# Patient Record
Sex: Female | Born: 1980 | Race: Black or African American | Hispanic: No | Marital: Married | Smoking: Former smoker
Health system: Southern US, Community
[De-identification: ages and names within clinical notes are randomized; demographics above are authoritative.]

## PROBLEM LIST (undated history)

## (undated) ENCOUNTER — Inpatient Hospital Stay (HOSPITAL_COMMUNITY): Payer: Self-pay

## (undated) DIAGNOSIS — IMO0002 Reserved for concepts with insufficient information to code with codable children: Secondary | ICD-10-CM

## (undated) DIAGNOSIS — D649 Anemia, unspecified: Secondary | ICD-10-CM

## (undated) DIAGNOSIS — R87619 Unspecified abnormal cytological findings in specimens from cervix uteri: Secondary | ICD-10-CM

## (undated) DIAGNOSIS — Z5189 Encounter for other specified aftercare: Secondary | ICD-10-CM

## (undated) DIAGNOSIS — B999 Unspecified infectious disease: Secondary | ICD-10-CM

## (undated) HISTORY — DX: Unspecified infectious disease: B99.9

## (undated) HISTORY — DX: Anemia, unspecified: D64.9

## (undated) HISTORY — DX: Reserved for concepts with insufficient information to code with codable children: IMO0002

## (undated) HISTORY — PX: WISDOM TOOTH EXTRACTION: SHX21

## (undated) HISTORY — DX: Encounter for other specified aftercare: Z51.89

## (undated) HISTORY — DX: Unspecified abnormal cytological findings in specimens from cervix uteri: R87.619

---

## 2000-03-27 DIAGNOSIS — B999 Unspecified infectious disease: Secondary | ICD-10-CM

## 2000-03-27 HISTORY — DX: Unspecified infectious disease: B99.9

## 2002-03-27 DIAGNOSIS — D649 Anemia, unspecified: Secondary | ICD-10-CM

## 2002-03-27 HISTORY — DX: Anemia, unspecified: D64.9

## 2003-03-28 HISTORY — PX: CHOLECYSTECTOMY: SHX55

## 2003-06-13 ENCOUNTER — Emergency Department (HOSPITAL_COMMUNITY): Admission: AD | Admit: 2003-06-13 | Discharge: 2003-06-13 | Payer: Self-pay | Admitting: Family Medicine

## 2003-08-02 ENCOUNTER — Emergency Department (HOSPITAL_COMMUNITY): Admission: EM | Admit: 2003-08-02 | Discharge: 2003-08-02 | Payer: Self-pay | Admitting: Family Medicine

## 2003-08-20 ENCOUNTER — Encounter (INDEPENDENT_AMBULATORY_CARE_PROVIDER_SITE_OTHER): Payer: Self-pay | Admitting: *Deleted

## 2003-08-20 ENCOUNTER — Observation Stay (HOSPITAL_COMMUNITY): Admission: RE | Admit: 2003-08-20 | Discharge: 2003-08-21 | Payer: Self-pay | Admitting: General Surgery

## 2003-11-19 ENCOUNTER — Emergency Department (HOSPITAL_COMMUNITY): Admission: EM | Admit: 2003-11-19 | Discharge: 2003-11-19 | Payer: Self-pay | Admitting: Emergency Medicine

## 2004-06-14 ENCOUNTER — Inpatient Hospital Stay (HOSPITAL_COMMUNITY): Admission: AD | Admit: 2004-06-14 | Discharge: 2004-06-14 | Payer: Self-pay | Admitting: *Deleted

## 2004-08-11 ENCOUNTER — Emergency Department (HOSPITAL_COMMUNITY): Admission: EM | Admit: 2004-08-11 | Discharge: 2004-08-11 | Payer: Self-pay | Admitting: Family Medicine

## 2005-05-08 ENCOUNTER — Emergency Department (HOSPITAL_COMMUNITY): Admission: EM | Admit: 2005-05-08 | Discharge: 2005-05-08 | Payer: Self-pay | Admitting: Family Medicine

## 2005-05-13 ENCOUNTER — Emergency Department (HOSPITAL_COMMUNITY): Admission: EM | Admit: 2005-05-13 | Discharge: 2005-05-13 | Payer: Self-pay | Admitting: Family Medicine

## 2005-06-20 ENCOUNTER — Ambulatory Visit: Payer: Self-pay | Admitting: Family Medicine

## 2006-03-07 ENCOUNTER — Emergency Department (HOSPITAL_COMMUNITY): Admission: EM | Admit: 2006-03-07 | Discharge: 2006-03-07 | Payer: Self-pay | Admitting: Family Medicine

## 2006-03-09 ENCOUNTER — Emergency Department (HOSPITAL_COMMUNITY): Admission: EM | Admit: 2006-03-09 | Discharge: 2006-03-09 | Payer: Self-pay | Admitting: Emergency Medicine

## 2006-05-29 ENCOUNTER — Telehealth: Payer: Self-pay | Admitting: *Deleted

## 2006-12-30 ENCOUNTER — Emergency Department (HOSPITAL_COMMUNITY): Admission: EM | Admit: 2006-12-30 | Discharge: 2006-12-30 | Payer: Self-pay | Admitting: Family Medicine

## 2007-06-25 ENCOUNTER — Telehealth: Payer: Self-pay | Admitting: *Deleted

## 2007-08-08 ENCOUNTER — Emergency Department (HOSPITAL_COMMUNITY): Admission: EM | Admit: 2007-08-08 | Discharge: 2007-08-08 | Payer: Self-pay | Admitting: Family Medicine

## 2008-07-03 ENCOUNTER — Encounter: Payer: Self-pay | Admitting: Family Medicine

## 2008-07-03 ENCOUNTER — Ambulatory Visit: Payer: Self-pay | Admitting: Family Medicine

## 2008-07-03 DIAGNOSIS — J309 Allergic rhinitis, unspecified: Secondary | ICD-10-CM | POA: Insufficient documentation

## 2008-07-03 DIAGNOSIS — M545 Low back pain: Secondary | ICD-10-CM

## 2008-07-03 LAB — CONVERTED CEMR LAB
ALT: 12 units/L (ref 0–35)
AST: 14 units/L (ref 0–37)
Alkaline Phosphatase: 67 units/L (ref 39–117)
BUN: 10 mg/dL (ref 6–23)
Creatinine, Ser: 0.8 mg/dL (ref 0.40–1.20)
HDL: 51 mg/dL (ref >39)
LDL Cholesterol: 68 mg/dL (ref 0–99)
Total Protein: 6.8 g/dL (ref 6.0–8.3)

## 2008-07-07 ENCOUNTER — Encounter: Payer: Self-pay | Admitting: Family Medicine

## 2008-07-09 LAB — CONVERTED CEMR LAB
Chlamydia, DNA Probe: NEGATIVE
GC Probe Amp, Genital: NEGATIVE

## 2008-07-28 ENCOUNTER — Telehealth (INDEPENDENT_AMBULATORY_CARE_PROVIDER_SITE_OTHER): Payer: Self-pay | Admitting: Family Medicine

## 2009-08-20 ENCOUNTER — Emergency Department (HOSPITAL_COMMUNITY): Admission: EM | Admit: 2009-08-20 | Discharge: 2009-08-20 | Payer: Self-pay | Admitting: Family Medicine

## 2010-06-13 LAB — CULTURE, ROUTINE-ABSCESS

## 2010-06-17 ENCOUNTER — Encounter: Payer: Self-pay | Admitting: Family Medicine

## 2010-06-17 ENCOUNTER — Ambulatory Visit (INDEPENDENT_AMBULATORY_CARE_PROVIDER_SITE_OTHER): Payer: Self-pay | Admitting: Family Medicine

## 2010-06-17 VITALS — BP 118/72 | HR 80 | Temp 98.8°F | Ht 60.0 in | Wt 196.0 lb

## 2010-06-17 DIAGNOSIS — J309 Allergic rhinitis, unspecified: Secondary | ICD-10-CM

## 2010-06-17 DIAGNOSIS — Z124 Encounter for screening for malignant neoplasm of cervix: Secondary | ICD-10-CM

## 2010-06-17 MED ORDER — FLUTICASONE PROPIONATE 50 MCG/ACT NA SUSP: 2.0000 | Freq: Every day | NASAL | Status: DC

## 2010-06-17 NOTE — Patient Instructions (Signed)
Congratulations for quitting smoking Less sugar, more veges, more fruit, less meat, and exercise daily Goal is loss of 60 pounds

## 2010-06-17 NOTE — Assessment & Plan Note (Signed)
Resume nasal steroid.

## 2010-06-17 NOTE — Assessment & Plan Note (Signed)
PAP

## 2010-06-17 NOTE — Progress Notes (Signed)
  Subjective:    Patient ID: Sherri [name], female    DOB: 13-Jun-1980, 30 y.o.   MRN: [id number]  HPI  Her for PAP, has had abnormal in the past with cryo therapy of the cervix when she was 15.  She does not recall any abnormal PAP smears since then.  She does report a C section, and irregular menses.  Seasonal allergies are kicking up, has used nasal steroids in the past and needs a refill.  One partner, planning on marrying.  Has no vagnial complaints, does not believe that she needs testing for STD.  She does not have insurance so she wants to keep the bill down.    Review of Systems  Constitutional: Negative for activity change and appetite change.  HENT: Positive for congestion, rhinorrhea and postnasal drip.   Respiratory: Positive for cough.   Gastrointestinal: Positive for constipation.  Genitourinary: Positive for menstrual problem. Negative for dysuria and frequency.       Irregular menses  Skin: Negative for rash.  Neurological: Negative for headaches.  Psychiatric/Behavioral: Negative for suicidal ideas and dysphoric mood. The patient is not nervous/anxious.        Objective:   Physical Exam  Constitutional: She is oriented to person, place, and time. She appears well-developed and well-nourished.  HENT:  Mouth/Throat: Oropharynx is clear and moist.  Eyes: EOM are normal. Pupils are equal, round, and reactive to light.  Neck: Normal range of motion. No thyromegaly present.  Cardiovascular: Normal rate, regular rhythm, normal heart sounds and intact distal pulses.   Pulmonary/Chest: Effort normal and breath sounds normal.  Abdominal: Soft. Bowel sounds are normal. There is no tenderness.  Genitourinary: Vagina normal. No vaginal discharge found.  Musculoskeletal: Normal range of motion. She exhibits no edema.  Lymphadenopathy:    She has no cervical adenopathy.  Neurological: She is alert and oriented to person, place, and time. She has normal reflexes.  Skin:  Skin is warm and dry.  Psychiatric: Her behavior is normal.          Assessment & Plan:

## 2010-06-22 ENCOUNTER — Encounter: Payer: Self-pay | Admitting: Family Medicine

## 2010-08-12 NOTE — Op Note (Signed)
NAME:  Sherri [name], Sherri [name]                      ACCOUNT NO.:  [id number]   MEDICAL RECORD NO.:  [id number]                   PATIENT TYPE:  AMB   LOCATION:  DAY                                  FACILITY:  [facility]   PHYSICIAN:  Anselm Pancoast. Zachery Dakins, M.D.          DATE OF BIRTH:  06-Jan-1981   DATE OF PROCEDURE:  08/20/2003  DATE OF DISCHARGE:                                 OPERATIVE REPORT   PREOPERATIVE DIAGNOSES:  Chronic cholecystitis.   POSTOPERATIVE DIAGNOSES:  Chronic cholecystitis.   OPERATION:  Cholecystectomy with cholangiogram.   ANESTHESIA:  General.   SURGEON:  Anselm Pancoast. Zachery Dakins, M.D.   ASSISTANT:  Sheppard Plumber. Earlene Plater, M.D.   ANESTHESIA:  General.   HISTORY:  Sherri [name] is a 30 year old female who approximately a year  ago delivered.  She had a C section but had an infection postoperatively.  She was living in [place], [place] then and was transferred to the  [place] at [place] and was treated with antibiotics  and kind of gradually recovered. During a CT during that hospitalization,  she was noted to have multiple gallstones within her gallbladder.  She moved  to the [place] area, has episodes of epigastric pain, approximately three  weeks ago had a bad episode on a Sunday after eating late Saturday night and  then was seen by Elvina Sidle, M.D. in the emergency room who reviewed  her records and suggested that she see a general surgeon.  I saw her in the  office on the 12th and at that time she was not having any acute abdominal  pain. We obtained the records from her hospitalizations in [place]  confirming she had stones and thought it would best to proceed on with a  laparoscopic cholecystectomy. She is only 5 feet tall but 221 pounds and  there was no evidence of any hernias that I noted. She had a Pfannenstiel  GYN incision and I recommended we proceed on with a laparoscopic  cholecystectomy and cholangiogram. She was in  agreement and states that she  has not had an episode of pain over last two weeks during this short week.  The patient preoperatively was given 400 mg of Cipro being allergic to  Penicillin and then the induction of general anesthesia, endotracheal tube,  oral tube into the stomach. The abdomen was prepped with Betadine surgical  scrub and solution, draped in a sterile manner. A small incision was made in  the fatty tissue below the umbilicus after prepping her sterilely and the  fascia was identified and picked up between Kocher's and we kind of  carefully entered into the peritoneal cavity. The pursestring suture of #0  Vicryl was placed, the Hasson cannula was introduced and the gallbladder was  just kind of packed with stones but not acutely inflamed. 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 Two lateral 5 mm ports were placed at the appropriate position. The  patient's abdominal wall with her size was kind of a floppy abdominal wall.  It was difficult to really get good exposure and the adhesions around the  gallbladder were taken down sharply and then the proximal portion of the  gallbladder we opened up the peritoneum and then dissected down identifying  the junction of the gallbladder in the cystic duct.  In the cystic artery, a  little area that we thought was just a branch of the anterior cystic artery  was doubly clipped proximally, singly distally and divided and then the  cystic duct was encompassed at its junction and clip placed flush with the  gallbladder.  A small opening was made in the proximal cystic duct, Cook  catheter introduced, held in place with a clip and x-ray was obtained which  showed probably about a 3 cm cystic duct and it had flow up to the  infrahepatic and into the duodenum. The catheter was removed and the cystic  duct was triply  clipped and then divided. The cystic lymph node was then  dissected and a little clip was placed on a little blood vessel that did not  look the major branch of the cystic artery but we never found any other  obvious blood vessel. The posterior branch was doubly clipped proximally and  then divided and then the gallbladder was freed from its bed using the hook  electrocautery.  I placed the gallbladder in an EndoCatch bag, there was a  little bit of clear bile in one area where the gallbladder was very adherent  right at the most distal portion of the gallbladder fairly dense adhesions.  The inspection of the more proximal area, hemostasis appeared adequate. We  irrigated and aspirated, irrigated and aspirated making sure there was no  evidence of any bleeding since I was not as confident as I would like to  have been as far as we definitely had identified the cystic artery and  expect that we identified it distal to the main branch that is why we saw  two vessels up in this area, neither very large. Both of these were doubly  clipped proximally.  Then we switched cameras to the upper 10 mm port,  withdrew the gallbladder __________ within the EndoCatch bag and then  reinserted the camera at the umbilicus and then reinspected and no evidence  of any bleeding. The little irrigating fluid in the pelvis was removed, she  has got numerous adhesions in the pelvis but there was nothing that looked  acutely inflamed and we then switched cameras to the upper 10 mm port again,  withdrew the Hasson cannula and put a second figure-of-eight suture at the  umbilicus, tied both and then anesthetized these. Looking at the umbilical  fascia from the inside, there was good fascia water tight closure and the  two lateral 5 mm ports withdrawn under direct vision.  The carbon dioxide  released, we had placed Marcaine at the umbilicus also and then the upper 10 mm trocar withdrawn. The subcutaneous wounds were  closed with 4-0 Vicryl and  hopefully she will tolerate a diet without problems today and be discharged  tomorrow.  Anselm Pancoast. Zachery Dakins, M.D.    WJW/MEDQ  D:  08/20/2003  T:  08/20/2003  Job:  [id number]

## 2010-12-01 ENCOUNTER — Ambulatory Visit (INDEPENDENT_AMBULATORY_CARE_PROVIDER_SITE_OTHER): Payer: Self-pay | Admitting: Family Medicine

## 2010-12-01 ENCOUNTER — Encounter: Payer: Self-pay | Admitting: Family Medicine

## 2010-12-01 VITALS — BP 125/80 | HR 100 | Temp 98.2°F | Wt 199.8 lb

## 2010-12-01 DIAGNOSIS — R21 Rash and other nonspecific skin eruption: Secondary | ICD-10-CM

## 2010-12-01 MED ORDER — TRIAMCINOLONE ACETONIDE 0.1 % EX CREA: TOPICAL_CREAM | Freq: Two times a day (BID) | CUTANEOUS | Status: AC

## 2010-12-01 NOTE — Assessment & Plan Note (Signed)
I am not sure the cause of this rash however I suspect this may be inflammatory/autoimmune.   We discussed the options of going to a dermatologist for self-pay or a biopsy or empiric treatment with steroids. The patient and I came to the agreement to attempt empiric treatment with low potency steroids. We will start with over-the-counter cortisone, and I did write for triamcinolone 0.1% cream to use if the hydrocortisone does not work.  She understands the risk of skin hypopigmentation and atrophy.   Plan to use until the skin is back to normal. We will followup in one month

## 2010-12-01 NOTE — Progress Notes (Signed)
Ms. Roger Shelter presents to the clinic today for rash.  She notes rash in her bilateral eyebrows 2 weeks following a eyebrow waxing episode.  She had her eyebrows waxed 2 months ago and noted 2 weeks afterwards beginning of rash.  She noticed the rash is itchy and flaky.  She has been applying Vaseline to take the flakiness down.  Additionally she is applying makeup overlying the rash and she works as a Tree surgeon.  She's had her eyebrows waxed multiple times and this has never happened before this episode.  She denies any personal or family history of lupus.  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  She feels well other well otherwise and denies any other symptoms.  PMH reviewed.  ROS as above otherwise neg  Exam:  BP 125/80  Pulse 100  Temp(Src) 98.2 F (36.8 C) (Oral)  Wt 199 lb 12.8 oz (90.629 kg)  LMP 11/24/2010 Gen: Well NAD HEENT: EOMI,  MMM Skin: Loss of hair follicles in the eyebrows bilaterally.  Both eyebrows on the medial aspect has a dime-sized patch of underlying skin thickness erythema with blue hyperpigmentation and fine scale.  The area is not hot or tender to the touch, and there is no surrounding erythema or inflammation.   She does have evidence of pustular acne now resolved on her face, however she does not have any other rashes elsewhere.

## 2010-12-01 NOTE — Patient Instructions (Signed)
Thank you for coming in today. Get some hydrocortisone 1% cream from the pharmacy and apply a small amount right on the rash twice a day for 1 week.  If you don't see any improvement I would like you do to the same with triamcinolone cream that I sent to the pharmacy.  Once the rash goes away and the skin returns to normal stop applying the creams.   You may have to apply some cream intermittently.  If you are not totally better in 1 month come back.

## 2010-12-17 ENCOUNTER — Ambulatory Visit (INDEPENDENT_AMBULATORY_CARE_PROVIDER_SITE_OTHER): Payer: Self-pay

## 2010-12-17 ENCOUNTER — Inpatient Hospital Stay (INDEPENDENT_AMBULATORY_CARE_PROVIDER_SITE_OTHER)
Admission: RE | Admit: 2010-12-17 | Discharge: 2010-12-17 | Disposition: A | Discharge status: Home / Self Care | Payer: Self-pay | Source: Ambulatory Visit | Attending: Emergency Medicine | Admitting: Emergency Medicine

## 2010-12-17 DIAGNOSIS — M779 Enthesopathy, unspecified: Secondary | ICD-10-CM

## 2012-04-01 ENCOUNTER — Encounter (HOSPITAL_COMMUNITY): Payer: Self-pay | Admitting: *Deleted

## 2012-04-01 ENCOUNTER — Emergency Department (HOSPITAL_COMMUNITY)
Admission: EM | Admit: 2012-04-01 | Discharge: 2012-04-01 | Disposition: A | Discharge status: Home / Self Care | Payer: Self-pay | Source: Home / Self Care

## 2012-04-01 DIAGNOSIS — S39012A Strain of muscle, fascia and tendon of lower back, initial encounter: Secondary | ICD-10-CM

## 2012-04-01 DIAGNOSIS — S335XXA Sprain of ligaments of lumbar spine, initial encounter: Secondary | ICD-10-CM

## 2012-04-01 DIAGNOSIS — Z3490 Encounter for supervision of normal pregnancy, unspecified, unspecified trimester: Secondary | ICD-10-CM

## 2012-04-01 DIAGNOSIS — N912 Amenorrhea, unspecified: Secondary | ICD-10-CM

## 2012-04-01 LAB — POCT PREGNANCY, URINE: Preg Test, Ur: POSITIVE — AB

## 2012-04-01 MED ORDER — NAPROXEN 500 MG PO TBEC: 500.0000 mg | DELAYED_RELEASE_TABLET | Freq: Two times a day (BID) | ORAL | Status: DC

## 2012-04-01 MED ORDER — PRENATAL VITAMINS PLUS 27-1 MG PO TABS: 1.0000 | ORAL_TABLET | Freq: Every day | ORAL | Status: AC

## 2012-04-01 NOTE — ED Provider Notes (Signed)
Medical screening examination/treatment/procedure(s) were performed by non-physician practitioner and as supervising physician I was immediately available for consultation/collaboration.  Leslee Home, M.D.   Reuben Likes, MD 04/01/12 [phone]

## 2012-04-01 NOTE — ED Notes (Signed)
Pt reports lower back pain for the past week and a half denies urinary pain or discharge - urine obtained at triage.

## 2012-04-01 NOTE — ED Provider Notes (Signed)
History     CSN: [id number]  Arrival date & time 04/01/12  1005   None     Chief Complaint  Patient presents with  . Back Pain    (Consider location/radiation/quality/duration/timing/severity/associated sxs/prior treatment) HPI Comments: 32 year old female is complaining of mid low back pain for several weeks. The pain is located in the lower paralumbar musculature. 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 When she goes home she is able to relax and get a comfortable position the pain abates. She also states she is 4-5 days past her due date for her menses.   History reviewed. No pertinent past medical history.  History reviewed. No pertinent past surgical history.  Family History  Problem Relation Age of Onset  . Family history unknown: Yes    History  Substance Use Topics  . Smoking status: Former Smoker    Quit date: 05/27/2010  . Smokeless tobacco: Not on file  . Alcohol Use: No    OB History    Grav Para Term Preterm Abortions TAB SAB Ect Mult Living                  Review of Systems  Constitutional: Negative for fever, chills and activity change.  HENT: Negative.   Respiratory: Negative.   Cardiovascular: Negative.   Genitourinary: Positive for menstrual problem. Negative for dysuria, urgency, frequency, hematuria, flank pain, decreased urine volume, vaginal bleeding, vaginal discharge, difficulty urinating, vaginal pain and pelvic pain.  Musculoskeletal:       As per HPI  Skin: Negative for color change, pallor and rash.  Neurological: Negative.   Psychiatric/Behavioral: Negative.     Allergies  Review of patient's allergies indicates no known allergies.  Home Medications   Current Outpatient Rx  Name  Route  Sig  Dispense  Refill  . FLUTICASONE PROPIONATE 50  MCG/ACT NA SUSP   Nasal   2 sprays by Nasal route daily.   16 g   3   . PRENATAL VITAMINS PLUS 27-1 MG PO TABS   Oral   Take 1 tablet by mouth daily. 1 tab po once daily   30 tablet   0     BP 136/81  Pulse 88  Temp 98.7 F (37.1 C) (Oral)  Resp 18  SpO2 100%  LMP 02/25/2012  Physical Exam  Constitutional: She is oriented to person, place, and time. She appears well-developed and well-nourished. No distress.  HENT:  Head: Normocephalic and atraumatic.  Eyes: EOM are normal. Pupils are equal, round, and reactive to light.  Neck: Normal range of motion. Neck supple.  Cardiovascular: Normal rate and normal heart sounds.   No murmur heard. Pulmonary/Chest: Effort normal and breath sounds normal. No respiratory distress. She has no wheezes.  Abdominal: Soft. She exhibits no distension and no mass. There is no rebound and no guarding.       Minor tenderness in the right and lower quadrants. Most of the tenderness is located along the anterior aspects of the iliac crests bilaterally  Musculoskeletal: Normal range of motion. She exhibits no edema.  Neurological: She is alert and oriented to person, place, and time. No cranial nerve deficit.  Skin: Skin is warm and dry.    ED Course  Procedures (including critical care time)  Labs  Reviewed  POCT PREGNANCY, URINE - Abnormal; Notable for the following:    Preg Test, Ur POSITIVE (*)     All other components within normal limits   No results found.   1. Lumbar strain   2. Amenorrhea   3. Pregnancy       MDM  No NSAIDs. Prenatal vitamins prescription written She was given verbal and written instructions on back maneuvers such as stretching and other exercises should help with her back pain. She may apply heat to the areas of pain in her back and if possible to not to stand so long at a time. Followup with your PCP in one needed appointment with an obstetrician in the next couple of months.        Hayden Rasmussen,  NP 04/01/12 1134

## 2012-04-22 ENCOUNTER — Ambulatory Visit: Payer: Medicaid Other | Admitting: Obstetrics and Gynecology

## 2012-04-22 ENCOUNTER — Encounter: Payer: Self-pay | Admitting: Obstetrics and Gynecology

## 2012-04-22 DIAGNOSIS — Z331 Pregnant state, incidental: Secondary | ICD-10-CM

## 2012-04-22 LAB — POCT URINALYSIS DIPSTICK
Leukocytes, UA: NEGATIVE
Nitrite, UA: NEGATIVE
Protein, UA: NEGATIVE
pH, UA: 5

## 2012-04-22 NOTE — Progress Notes (Signed)
NOB interview. Exposed to hair products at work. Records requested from [facility] and [place], [place] regarding C/S and stillbirth.

## 2012-04-23 LAB — CULTURE, OB URINE: Colony Count: NO GROWTH

## 2012-04-23 LAB — GC/CHLAMYDIA PROBE AMP
CT Probe RNA: NEGATIVE
GC Probe RNA: NEGATIVE

## 2012-04-24 LAB — PRENATAL PANEL VII
Basophils Absolute: 0 10*3/uL (ref 0.0–0.1)
Eosinophils Absolute: 0.1 10*3/uL (ref 0.0–0.7)
HCT: 35.9 % — ABNORMAL LOW (ref 36.0–46.0)
HIV: NONREACTIVE
Lymphocytes Relative: 31 % (ref 12–46)
Lymphs Abs: 1.5 10*3/uL (ref 0.7–4.0)
MCHC: 34.3 g/dL (ref 30.0–36.0)
Neutrophils Relative %: 59 % (ref 43–77)
RBC: 3.94 MIL/uL (ref 3.87–5.11)
RDW: 14.1 % (ref 11.5–15.5)
Rh Type: POSITIVE

## 2012-04-24 LAB — HEMOGLOBINOPATHY EVALUATION
Hgb F Quant: 0 % (ref 0.0–2.0)
Hgb S Quant: 0 %

## 2012-05-03 ENCOUNTER — Encounter: Payer: Self-pay | Admitting: Obstetrics and Gynecology

## 2012-05-03 NOTE — Progress Notes (Addendum)
Records from 2004 PP hospitalization at [facility] and delivery records from [place] including stillbirth available in medical records.

## 2012-05-09 ENCOUNTER — Ambulatory Visit: Payer: Medicaid Other | Admitting: Obstetrics and Gynecology

## 2012-05-09 ENCOUNTER — Encounter: Payer: Self-pay | Admitting: Obstetrics and Gynecology

## 2012-05-09 VITALS — BP 114/72 | Wt 206.0 lb

## 2012-05-09 DIAGNOSIS — J45909 Unspecified asthma, uncomplicated: Secondary | ICD-10-CM

## 2012-05-09 DIAGNOSIS — Z369 Encounter for antenatal screening, unspecified: Secondary | ICD-10-CM

## 2012-05-09 DIAGNOSIS — Z98891 History of uterine scar from previous surgery: Secondary | ICD-10-CM

## 2012-05-09 DIAGNOSIS — O09299 Supervision of pregnancy with other poor reproductive or obstetric history, unspecified trimester: Secondary | ICD-10-CM

## 2012-05-09 DIAGNOSIS — E669 Obesity, unspecified: Secondary | ICD-10-CM

## 2012-05-09 DIAGNOSIS — O0990 Supervision of high risk pregnancy, unspecified, unspecified trimester: Secondary | ICD-10-CM

## 2012-05-09 DIAGNOSIS — Z124 Encounter for screening for malignant neoplasm of cervix: Secondary | ICD-10-CM

## 2012-05-09 NOTE — Progress Notes (Signed)
[redacted]w[redacted]d Pt states she had a little bit of pink spotting after i/c. No other problems.

## 2012-05-09 NOTE — Addendum Note (Signed)
Addended byWinfred Leeds on: 05/09/2012 02:12 PM   Modules accepted: Orders

## 2012-05-09 NOTE — Progress Notes (Addendum)
[facility] NEW OB EXAMINATION   Sherri [name] is a 32 y.o. female, G3P2001, who presents at [redacted]w[redacted]d gestation for a new obstetrical examination. The patient states that her last menstrual period on February 25, 2012 was completely normal.  Her due date is December 01, 2012.  She has not had an ultrasound during this pregnancy.  The patient had one episode of spotting after intercourse 2 weeks ago.  In 2002 the patient had a term pregnancy with a fetal demise of uncertain etiology.  No autopsy was performed.  The patient had a cesarean section in 2006 after a failed induction and then prolonged rupture of membranes.  She had a large blood loss requiring a blood transfusion.  The patient has a past history of asthma.  She has not had an asthma attack in 2 years.  The following portions of the patient's history were reviewed and updated as appropriate: allergies, current medications, past family history, past medical history, past social history, past surgical history and problem list.  OB History   Grav Para Term Preterm Abortions TAB SAB Ect Mult Living   3 2 2       1      Obstetric Comments   2004 FAILED INDUCTION;  PROLONGED ROM; C/S; PP BLOOD TRANSFUSION DUE TO BLOOD LOSS DURING C/S; PP ENDOMETRITIS; HOSPITALIZED FOR 4 WEEKS AT [facility]      Past Medical History  Diagnosis Date  . Blood transfusion without reported diagnosis     PP 2006  . Abnormal Pap smear AGE 41     COLPO; LAST PAP 2012  . Infection 2002    CHLAMYDIA  . Infection 2002    GC  . Infection 2004    PP ENDOMETRITIS  . Anemia 2004    PP    Past Surgical History  Procedure Laterality Date  . Cesarean section  2004  . Cholecystectomy  2005  . Wisdom tooth extraction  AS TEEN    Family History  Problem Relation Age of Onset  . Diabetes Mother   . Heart disease Mother   . Arthritis Father   . COPD Father   . Cancer Father     PROSTATE  . Hypertension Father     Social History:  reports that she quit  smoking about 1 years ago. She has never used smokeless tobacco. She reports that  drinks alcohol. She reports that she uses illicit drugs about twice per week.  Allergies: No Known Allergies  Medications: prenatal vitamins, Flonase as needed   Objective:    BP 114/72  Wt 206 lb (93.441 kg)  BMI 40.23 kg/m2  LMP 02/25/2012    Weight:  Wt Readings from Last 1 Encounters:  05/09/12 206 lb (93.441 kg)          BMI: Body mass index is 40.23 kg/(m^2).  General Appearance: Alert, appropriate appearance for age. No acute distress HEENT: Grossly normal Neck / Thyroid: Supple, no masses, nodes or enlargement Lungs: clear to auscultation bilaterally Back: No CVA tenderness Breast Exam: No masses or nodes.No dimpling, nipple retraction or discharge. Cardiovascular: Regular rate and rhythm. S1, S2, no murmur Gastrointestinal: Soft, non-tender, no masses or organomegaly.                               Fundal height: 10 weeks  Fetal heart tones audible: yes, 150 bpm  ++++++++++++++++++++++++++++++++++++++++++++++++++++++++  Pelvic Exam: External genitalia: normal general appearance Vaginal: normal without tenderness, induration or masses and relaxation: Yes Cervix: normal appearance Adnexa: normal bimanual exam Uterus: gravid, nontender, 10 weeks size  ++++++++++++++++++++++++++++++++++++++++++++++++++++++++  Lymphatic Exam: Non-palpable nodes in neck, clavicular, axillary, or inguinal regions Neurologic: Normal speech, no tremor  Psychiatric: Alert and oriented, appropriate affect.  Prenatal labs: ABO, Rh: O/POS/-- (01/27 0102) Antibody: NEG (01/27 0938) Rubella:  immune RPR: NON REAC (01/27 7253)  HBsAg: NEGATIVE (01/27 6644)  HIV: NON REACTIVE (01/27 0938)  GBS:   pending until the third trimester Hemoglobin: 12.3 Platelet count: 257,000 Sickle cell: Negative Gonorrhea: Negative Chlamydia: Negative Urine culture: Negative  Wet Prep:    Previously done:            no                     If no: Whiff:                     Negative                              Clue cells:             no                              PH:                        > 4.5                              Yeast:                    no                              Trichomoniasis:    no    Assessment:   32 y.o. female G3P2001 at [redacted]w[redacted]d gestation ( EDC is @EDC @) by: Normal Last menstrual period: yes                History of a term fetal demise of uncertain etiology  History of a prior cesarean section  History of asthma  Obesity  Desires sterilization   Plan:    Pap smear sent.  We discussed routine pregnancy issues:  Toxoplasmosis was reviewed.  The patient was told to avoid cat liter boxes and feces.  The patient was told to avoid predator fish including tuna because of our concerns for mercury consumption.  The patient was told to avoid soft cheeses.  The patient was told to be sure that all lunch meats are well cooked.  Genetic screening was discussed. First trimester screen in 2 weeks.  The patient will have an early Glucola screen due to a history of prior pregnancy with fetal demise.  Our model for pregnancy management was reviewed.  Proper diet and exercise reviewed.  Return to office in 2 weeks.  Medications include:  Prenatal vitamins.  VBAC consent form given to the patient.  She will review and then sign at next visit.  Mylinda Latina.D.

## 2012-05-13 LAB — PAP IG W/ RFLX HPV ASCU

## 2012-05-23 ENCOUNTER — Encounter: Payer: Self-pay | Admitting: Obstetrics and Gynecology

## 2012-05-23 ENCOUNTER — Ambulatory Visit: Payer: Medicaid Other

## 2012-05-23 ENCOUNTER — Ambulatory Visit: Payer: Medicaid Other | Admitting: Obstetrics and Gynecology

## 2012-05-23 ENCOUNTER — Other Ambulatory Visit: Payer: Self-pay | Admitting: Obstetrics and Gynecology

## 2012-05-23 VITALS — BP 110/70 | Wt 212.0 lb

## 2012-05-23 DIAGNOSIS — Z331 Pregnant state, incidental: Secondary | ICD-10-CM

## 2012-05-23 DIAGNOSIS — Z369 Encounter for antenatal screening, unspecified: Secondary | ICD-10-CM

## 2012-05-23 NOTE — Progress Notes (Signed)
[redacted]w[redacted]d [place] s=d  NT 1.8 mm First trrimester scfreen done today

## 2012-05-23 NOTE — Progress Notes (Signed)
Pt declines flu shot at this time.

## 2012-06-13 ENCOUNTER — Ambulatory Visit: Payer: Medicaid Other | Admitting: Obstetrics and Gynecology

## 2012-06-13 ENCOUNTER — Encounter: Payer: Self-pay | Admitting: Obstetrics and Gynecology

## 2012-06-13 VITALS — BP 108/60 | Wt 215.0 lb

## 2012-06-13 DIAGNOSIS — Z331 Pregnant state, incidental: Secondary | ICD-10-CM

## 2012-06-13 NOTE — Patient Instructions (Signed)
Round Ligament Pain  The round ligament is made up of muscle and fibrous tissue. It is attached to the uterus near the fallopian tube. The round ligament is located on both sides of the uterus and helps support the position of the uterus. It usually begins in the second trimester of pregnancy when the uterus comes out of the pelvis. The pain can come and go until the baby is delivered. Round ligament pain is not a serious problem and does not cause harm to the baby.  CAUSE  During pregnancy the uterus grows the most from the second trimester to delivery. As it grows, it stretches and slightly twists the round ligaments. When the uterus leans from one side to the other, the round ligament on the opposite side pulls and stretches. This can cause pain.  SYMPTOMS   Pain can occur on one side or both sides. The pain is usually a short, sharp, and pinching-like. Sometimes it can be a dull, lingering and aching pain. The pain is located in the lower side of the abdomen or in the groin. The pain is internal and usually starts deep in the groin and moves up to the outside of the hip area. Pain can occur with:  · Sudden change in position like getting out of bed or a chair.  · Rolling over in bed.  · Coughing or sneezing.  · Walking too much.  · Any type of physical activity.  DIAGNOSIS   Your caregiver will make sure there are no serious problems causing the pain. When nothing serious is found, the symptoms usually indicate that the pain is from the round ligament.  TREATMENT   · Sit down and relax when the pain starts.  · Flex your knees up to your belly.  · Lay on your side with a pillow under your belly (abdomen) and another one between your legs.  · Sit in a hot bath for 15 to 20 minutes or until the pain goes away.  HOME CARE INSTRUCTIONS   · Only take over-the-counter or prescriptions medicines for pain, discomfort or fever as directed by your caregiver.  · Sit and stand slowly.  · Avoid long walks if it causes  pain.  · Stop or lessen your physical activities if it causes pain.  SEEK MEDICAL CARE IF:   · The pain does not go away with any of your treatment.  · You need stronger medication for the pain.  · You develop back pain that you did not have before with the side pain.  SEEK IMMEDIATE MEDICAL CARE IF:   · You develop a temperature of 102° F (38.9° C) or higher.  · You develop uterine contractions.  · You develop vaginal bleeding.  · You develop nausea, vomiting or diarrhea.  · You develop chills.  · You have pain when you urinate.  Document Released: 12/21/2007 Document Revised: 06/05/2011 Document Reviewed: 12/21/2007  [employer]® Patient Information ©2013 [employer].

## 2012-06-13 NOTE — Progress Notes (Signed)
[redacted]w[redacted]d First trimester screen WNL Anatomy US @[facility] 

## 2012-06-13 NOTE — Progress Notes (Signed)
Pt decline flu shot at this time

## 2012-08-16 ENCOUNTER — Inpatient Hospital Stay (HOSPITAL_COMMUNITY)
Admission: AD | Admit: 2012-08-16 | Discharge: 2012-08-17 | Disposition: A | Discharge status: Home / Self Care | Payer: Medicaid Other | Source: Ambulatory Visit | Attending: Obstetrics and Gynecology | Admitting: Obstetrics and Gynecology

## 2012-08-16 ENCOUNTER — Encounter (HOSPITAL_COMMUNITY): Payer: Self-pay | Admitting: *Deleted

## 2012-08-16 DIAGNOSIS — O99891 Other specified diseases and conditions complicating pregnancy: Secondary | ICD-10-CM | POA: Insufficient documentation

## 2012-08-16 DIAGNOSIS — M549 Dorsalgia, unspecified: Secondary | ICD-10-CM | POA: Insufficient documentation

## 2012-08-16 DIAGNOSIS — IMO0002 Reserved for concepts with insufficient information to code with codable children: Secondary | ICD-10-CM | POA: Insufficient documentation

## 2012-08-16 DIAGNOSIS — M79609 Pain in unspecified limb: Secondary | ICD-10-CM | POA: Insufficient documentation

## 2012-08-16 MED ORDER — IBUPROFEN 600 MG PO TABS
600.0000 mg | ORAL_TABLET | Freq: Once | ORAL | Status: AC
  Administered 2012-08-16: 600 mg via ORAL
  Filled 2012-08-16: qty 1

## 2012-08-16 MED ORDER — IBUPROFEN 200 MG PO TABS: 600.0000 mg | ORAL_TABLET | Freq: Four times a day (QID) | ORAL | Status: DC | PRN

## 2012-08-16 NOTE — Progress Notes (Signed)
S. Lillard CNM notified of pt's admission and status. Will see pt 

## 2012-08-16 NOTE — Progress Notes (Signed)
Gevena Barre CNM in to see pt. EFM d/ced per CNM

## 2012-08-16 NOTE — MAU Note (Signed)
Noticed swelling R buttocks and R ankle since Weds. Bruising R knee.

## 2012-08-16 NOTE — MAU Provider Note (Signed)
History     CSN: [id number]  Arrival date and time: 08/16/12 2135   First Provider Initiated Contact with Patient 08/16/12 2332      Chief Complaint  Patient presents with  . Leg Swelling   HPI Comments: Pt is a 31yo G3P2001 at [redacted]w[redacted]d that arrived unannounced w cc of R hip swelling and back pain and pain in thigh. Has had pain for several days but was worse tonight after being on her feet all day today. She denies any ctx, VB or LOF, reports +FM.      Past Medical History  Diagnosis Date  . Blood transfusion without reported diagnosis     PP 2006  . Abnormal Pap smear AGE 20     COLPO; LAST PAP 2012  . Infection 2002    CHLAMYDIA  . Infection 2002    GC  . Infection 2004    PP ENDOMETRITIS  . Anemia 2004    PP    Past Surgical History  Procedure Laterality Date  . Cesarean section  2004  . Cholecystectomy  2005  . Wisdom tooth extraction  AS TEEN    Family History  Problem Relation Age of Onset  . Diabetes Mother   . Heart disease Mother   . Arthritis Father   . COPD Father   . Cancer Father     PROSTATE  . Hypertension Father     History  Substance Use Topics  . Smoking status: Former Smoker    Quit date: 05/27/2010  . Smokeless tobacco: Never Used  . Alcohol Use: Yes     Comment: WEEKENDS; LAST DRANK 03/27/2012    Allergies: No Known Allergies  Prescriptions prior to admission  Medication Sig Dispense Refill  . Prenatal Vit-Fe Fumarate-FA (PRENATAL VITAMINS PLUS) 27-1 MG TABS Take 1 tablet by mouth daily. 1 tab po once daily  30 tablet  0  . fluticasone (FLONASE) 50 MCG/ACT nasal spray 2 sprays by Nasal route daily.  16 g  3  . Prenatal Vit-Fe Fumarate-FA (VITAFOL-OB PO) Take by mouth.        Review of Systems  Respiratory: Negative for shortness of breath.   Cardiovascular: Negative for chest pain.  Musculoskeletal: Negative for joint pain and falls.       R Hip, buttock and thigh are "achey"   Neurological: Negative for dizziness, tingling,  sensory change, focal weakness, weakness and headaches.  All other systems reviewed and are negative.   Physical Exam   Blood pressure 132/76, pulse 99, temperature 98.5 F (36.9 C), resp. rate 18, height 5' (1.524 m), weight 229 lb (103.874 kg), last menstrual period 02/25/2012.  Physical Exam  Nursing note and vitals reviewed. Constitutional: She is oriented to person, place, and time. She appears well-developed and well-nourished.  HENT:  Head: Normocephalic.  Eyes: Pupils are equal, round, and reactive to light.  Neck: Normal range of motion.  Cardiovascular: Normal rate, regular rhythm and normal heart sounds.   Respiratory: Effort normal and breath sounds normal.  GI: Soft. Bowel sounds are normal. She exhibits no distension.  AGA  Genitourinary:  Deferred   Musculoskeletal: Normal range of motion. She exhibits no edema and no tenderness.  Neg homan's sign   Neurological: She is alert and oriented to person, place, and time. She has normal reflexes.  Skin: Skin is warm and dry.  Psychiatric: She has a normal mood and affect. Her behavior is normal.    MAU Course  Procedures    Assessment and  Plan  IUP at [redacted]w[redacted]d FHR reassuring for GA VSS Pain appears to be RLP and R sciatica Will give motrin 600mg  PO and Rx for motrin Handout on RLP Enc frequent stretching and sitting down breaks at work  Call office if pain worsens, swelling is significant or any other difficulty  Will let office know to Refer for  physical therapy, as well.   Darald Uzzle M 08/16/2012, 11:49 PM

## 2012-08-17 ENCOUNTER — Encounter: Payer: Self-pay | Admitting: Obstetrics and Gynecology

## 2012-08-17 NOTE — Progress Notes (Signed)
Written and verbal d/c instructions given and understanding voiced. 

## 2012-09-03 ENCOUNTER — Ambulatory Visit: Payer: Medicaid Other | Attending: Obstetrics and Gynecology

## 2012-09-03 DIAGNOSIS — IMO0001 Reserved for inherently not codable concepts without codable children: Secondary | ICD-10-CM | POA: Insufficient documentation

## 2012-09-03 DIAGNOSIS — N949 Unspecified condition associated with female genital organs and menstrual cycle: Secondary | ICD-10-CM | POA: Insufficient documentation

## 2012-09-03 DIAGNOSIS — M899 Disorder of bone, unspecified: Secondary | ICD-10-CM | POA: Insufficient documentation

## 2012-09-03 DIAGNOSIS — M259 Joint disorder, unspecified: Secondary | ICD-10-CM | POA: Insufficient documentation

## 2012-09-03 DIAGNOSIS — O9989 Other specified diseases and conditions complicating pregnancy, childbirth and the puerperium: Secondary | ICD-10-CM | POA: Insufficient documentation

## 2012-09-03 DIAGNOSIS — M5430 Sciatica, unspecified side: Secondary | ICD-10-CM | POA: Insufficient documentation

## 2012-11-09 ENCOUNTER — Inpatient Hospital Stay (HOSPITAL_COMMUNITY)
Admission: AD | Admit: 2012-11-09 | Discharge: 2012-11-10 | Disposition: A | Discharge status: Home / Self Care | Payer: Medicaid Other | Source: Ambulatory Visit | Attending: Obstetrics and Gynecology | Admitting: Obstetrics and Gynecology

## 2012-11-09 ENCOUNTER — Encounter (HOSPITAL_COMMUNITY): Payer: Self-pay | Admitting: *Deleted

## 2012-11-09 DIAGNOSIS — R81 Glycosuria: Secondary | ICD-10-CM | POA: Insufficient documentation

## 2012-11-09 DIAGNOSIS — O99891 Other specified diseases and conditions complicating pregnancy: Secondary | ICD-10-CM | POA: Insufficient documentation

## 2012-11-09 DIAGNOSIS — O26899 Other specified pregnancy related conditions, unspecified trimester: Secondary | ICD-10-CM

## 2012-11-09 DIAGNOSIS — IMO0001 Reserved for inherently not codable concepts without codable children: Secondary | ICD-10-CM | POA: Insufficient documentation

## 2012-11-09 DIAGNOSIS — R109 Unspecified abdominal pain: Secondary | ICD-10-CM | POA: Insufficient documentation

## 2012-11-09 LAB — URINALYSIS, ROUTINE W REFLEX MICROSCOPIC
Bilirubin Urine: NEGATIVE
Glucose, UA: >1000 mg/dL — AB
Ketones, ur: NEGATIVE mg/dL
Nitrite: NEGATIVE
Urobilinogen, UA: 0.2 mg/dL (ref 0.0–1.0)

## 2012-11-09 MED ORDER — ACETAMINOPHEN 325 MG PO TABS
650.0000 mg | ORAL_TABLET | Freq: Four times a day (QID) | ORAL | Status: DC | PRN
  Administered 2012-11-09: 650 mg via ORAL
  Filled 2012-11-09: qty 2

## 2012-11-09 MED ORDER — CYCLOBENZAPRINE HCL 10 MG PO TABS
10.0000 mg | ORAL_TABLET | Freq: Three times a day (TID) | ORAL | Status: DC | PRN
  Administered 2012-11-09: 10 mg via ORAL
  Filled 2012-11-09: qty 1

## 2012-11-09 NOTE — MAU Provider Note (Signed)
History   CSN: [id number]  Arrival date and time: 11/09/12 2126   Chief Complaint  Patient presents with  . Abdominal Pain   HPI Pt presents to MAU with c/o constant lower abdominal pain since approx 3:30 this afternoon which has increased in intensity.  States pain waxes and wanes at time and is sharp as well as achy.  Also reports some cramping.  Reports normal fetal activity.  Denies ROM or bldg.  Tried warm bath, rest and increasing liquids without improvement in pain prior to presentation.  Plans VBAC with this pregnancy-last preg had C/S due to failed IOL with subsequent endometritis and anemia requiring blood transfusion.  Ranks pain as 8 on scale of 10.  Pt reports she drank pepsi and ate a swiss cake roll prior to admission.    OB History   Grav Para Term Preterm Abortions TAB SAB Ect Mult Living   3 2 2       1      Obstetric Comments   2004 FAILED INDUCTION;  PROLONGED ROM; C/S; PP BLOOD TRANSFUSION DUE TO BLOOD LOSS DURING C/S; PP ENDOMETRITIS; HOSPITALIZED FOR 4 WEEKS AT [facility]      Past Medical History  Diagnosis Date  . Blood transfusion without reported diagnosis     PP 2006  . Abnormal Pap smear AGE 63     COLPO; LAST PAP 2012  . Infection 2002    CHLAMYDIA  . Infection 2002    GC  . Infection 2004    PP ENDOMETRITIS  . Anemia 2004    PP    Past Surgical History  Procedure Laterality Date  . Cesarean section  2004  . Cholecystectomy  2005  . Wisdom tooth extraction  AS TEEN    Family History  Problem Relation Age of Onset  . Diabetes Mother   . Heart disease Mother   . Arthritis Father   . COPD Father   . Cancer Father     PROSTATE  . Hypertension Father     History  Substance Use Topics  . Smoking status: Former Smoker    Quit date: 05/27/2010  . Smokeless tobacco: Never Used  . Alcohol Use: Yes     Comment: WEEKENDS; LAST DRANK 03/27/2012    Allergies: No Known Allergies  Prescriptions prior to admission  Medication Sig Dispense Refill   . Prenatal Vit-Fe Fumarate-FA (PRENATAL VITAMINS PLUS) 27-1 MG TABS Take 1 tablet by mouth daily. 1 tab po once daily  30 tablet  0  . fluticasone (FLONASE) 50 MCG/ACT nasal spray 2 sprays by Nasal route daily.  16 g  3  . ibuprofen (MOTRIN IB) 200 MG tablet Take 3 tablets (600 mg total) by mouth every 6 (six) hours as needed for pain.  30 tablet  0  . Prenatal Vit-Fe Fumarate-FA (VITAFOL-OB PO) Take by mouth.        Review of Systems  Constitutional: Negative.   HENT: Negative.   Eyes: Negative.   Respiratory: Negative.   Cardiovascular: Negative.   Gastrointestinal: Negative.   Genitourinary: Negative.   Musculoskeletal: Negative.   Skin: Negative.   Neurological: Negative.   Endo/Heme/Allergies: Negative.   Psychiatric/Behavioral: Negative.    Physical Exam   Blood pressure 127/76, pulse 85, temperature 98.2 F (36.8 C), temperature source Oral, height 5' (1.524 m), weight 110.848 kg (244 lb 6 oz), last menstrual period 02/25/2012.  Physical Exam  Constitutional: She is oriented to person, place, and time. She appears well-developed and well-nourished.  HENT:  Head: Normocephalic and atraumatic.  Right Ear: External ear normal.  Left Ear: External ear normal.  Nose: Nose normal.  Eyes: Conjunctivae are normal. Pupils are equal, round, and reactive to light.  Neck: Normal range of motion. Neck supple.  Cardiovascular: Normal rate, regular rhythm and intact distal pulses.   Respiratory: Effort normal and breath sounds normal.  GI: Soft. Bowel sounds are normal. She exhibits no distension. There is no tenderness. There is no rebound and no guarding.  Pt indicates area of pain in bilat lower abd under pannus.   Genitourinary: Uterus normal.  Ext genitalia WNL. BUS neg.  Speculum exam deferred.  SVE closed/50%/presenting part not palpable. Uterus soft, gravid, nontender.    Musculoskeletal: Normal range of motion.  Neurological: She is alert and oriented to person, place, and  time. She has normal reflexes.  Skin: Skin is warm and dry.  Psychiatric: She has a normal mood and affect. Her behavior is normal.   FHR baseline 145 bpm.  Variability moderate; Accels present; Variable x 1 present to nadir of 130 x 10 secs. FHR Cat 1. Toco: UCs every 2-6.5 mins, mild to palpation.  Pt talking through UCs.    MAU Course  Procedures Results for orders placed during the hospital encounter of 11/09/12 (from the past 24 hour(s))  URINALYSIS, ROUTINE W REFLEX MICROSCOPIC     Status: Abnormal   Collection Time    11/09/12 10:00 PM      Result Value Range   Color, Urine YELLOW  YELLOW   APPearance CLEAR  CLEAR   Specific Gravity, Urine 1.025  1.005 - 1.030   pH 6.0  5.0 - 8.0   Glucose, UA >1000 (*) NEGATIVE mg/dL   Hgb urine dipstick TRACE (*) NEGATIVE   Bilirubin Urine NEGATIVE  NEGATIVE   Ketones, ur NEGATIVE  NEGATIVE mg/dL   Protein, ur NEGATIVE  NEGATIVE mg/dL   Urobilinogen, UA 0.2  0.0 - 1.0 mg/dL   Nitrite NEGATIVE  NEGATIVE   Leukocytes, UA NEGATIVE  NEGATIVE  URINE MICROSCOPIC-ADD ON     Status: Abnormal   Collection Time    11/09/12 10:00 PM      Result Value Range   Squamous Epithelial / LPF MANY (*) RARE   WBC, UA 0-2  <3 WBC/hpf   Bacteria, UA FEW (*) RARE   CBG (last 3)   Recent Labs  11/09/12 2319  GLUCAP 243*      Assessment and Plan  IUP at 36w 6d Abdominal pain-c/w musculoskeletal pain. Prev C/S-undelivered. Glycosuria  Tylenol given x 1 dose.  Offered pt Percocet which she declines at present.   Flexeril 10mg  po x 1. Will check CBG due to glycosuria.    Darryl Willner O. 11/09/2012, 10:40 PM    Subjective:  Pt reports her pain is much improved at present and now ranks as 4-5/10 on pain scale.  Reports UCs are less intense and feel less frequent as well.  States she feels meds prev given are helping.  Objective:  Pt smiling and laughing through UCs. FHR baseline 135 bpm; moderate variabiltiy; accels present.  Decels absent. Toco:  UCs every 4-8 mins, mild to palpation. Repeat SVE deferred.  Assessment: IUP at 36w 6d Abdominal pain-c/w musculoskeletal pain Glycosuria Prev C/S  Plan: Discharge to home.   Rev s/s labor. F/U as sched at [facility] on Tuesday November 12, 2012 Rev fetal kick counts.   Rec continue with increased po fluids and mat support belt for lower abd musuculoskeletal pain. Will  have office recheck random glucose at next OV due to elevated CBG here.    Rhona Leavens 11/10/12, 0010

## 2012-11-09 NOTE — MAU Note (Signed)
PT SAYS SHE HURT BAD SINCE  8 PM.   VE ON Monday- CLOSED.    DENIES HSV AND MRSA.

## 2012-11-12 ENCOUNTER — Encounter (HOSPITAL_COMMUNITY): Payer: Self-pay

## 2012-11-12 ENCOUNTER — Encounter (HOSPITAL_COMMUNITY): Payer: Self-pay | Admitting: Anesthesiology

## 2012-11-12 ENCOUNTER — Inpatient Hospital Stay (HOSPITAL_COMMUNITY)
Admission: AD | Admit: 2012-11-12 | Discharge: 2012-11-16 | DRG: 765 | Disposition: A | Discharge status: Home / Self Care | Payer: Medicaid Other | Source: Ambulatory Visit | Attending: Obstetrics and Gynecology | Admitting: Obstetrics and Gynecology

## 2012-11-12 ENCOUNTER — Inpatient Hospital Stay (HOSPITAL_COMMUNITY): Payer: Medicaid Other | Admitting: Anesthesiology

## 2012-11-12 DIAGNOSIS — O99892 Other specified diseases and conditions complicating childbirth: Secondary | ICD-10-CM | POA: Diagnosis present

## 2012-11-12 DIAGNOSIS — O4290 Premature rupture of membranes, unspecified as to length of time between rupture and onset of labor, unspecified weeks of gestation: Principal | ICD-10-CM | POA: Diagnosis present

## 2012-11-12 DIAGNOSIS — S3769XA Other injury of uterus, initial encounter: Secondary | ICD-10-CM

## 2012-11-12 DIAGNOSIS — Z98891 History of uterine scar from previous surgery: Secondary | ICD-10-CM

## 2012-11-12 DIAGNOSIS — L02219 Cutaneous abscess of trunk, unspecified: Secondary | ICD-10-CM | POA: Diagnosis present

## 2012-11-12 DIAGNOSIS — D649 Anemia, unspecified: Secondary | ICD-10-CM | POA: Diagnosis present

## 2012-11-12 DIAGNOSIS — O34219 Maternal care for unspecified type scar from previous cesarean delivery: Secondary | ICD-10-CM | POA: Diagnosis present

## 2012-11-12 DIAGNOSIS — G43909 Migraine, unspecified, not intractable, without status migrainosus: Secondary | ICD-10-CM | POA: Insufficient documentation

## 2012-11-12 DIAGNOSIS — Z22330 Carrier of Group B streptococcus: Secondary | ICD-10-CM

## 2012-11-12 DIAGNOSIS — O909 Complication of the puerperium, unspecified: Secondary | ICD-10-CM | POA: Diagnosis present

## 2012-11-12 LAB — CBC
HCT: 34.9 % — ABNORMAL LOW (ref 36.0–46.0)
MCH: 30.6 pg (ref 26.0–34.0)
MCHC: 34.7 g/dL (ref 30.0–36.0)
MCV: 88.1 fL (ref 78.0–100.0)
RDW: 14.5 % (ref 11.5–15.5)

## 2012-11-12 LAB — ABO/RH: ABO/RH(D): O POS

## 2012-11-12 LAB — PREPARE RBC (CROSSMATCH)

## 2012-11-12 LAB — RPR: RPR Ser Ql: NONREACTIVE

## 2012-11-12 LAB — AMNISURE RUPTURE OF MEMBRANE (ROM) NOT AT ARMC: Amnisure ROM: POSITIVE

## 2012-11-12 MED ORDER — FENTANYL 2.5 MCG/ML BUPIVACAINE 1/10 % EPIDURAL INFUSION (WH - ANES)
INTRAMUSCULAR | Status: DC | PRN
  Administered 2012-11-12: 14 mL/h via EPIDURAL

## 2012-11-12 MED ORDER — BUTORPHANOL TARTRATE 1 MG/ML IJ SOLN
1.0000 mg | INTRAMUSCULAR | Status: DC | PRN
  Administered 2012-11-12: 1 mg via INTRAVENOUS
  Filled 2012-11-12: qty 1

## 2012-11-12 MED ORDER — EPHEDRINE 5 MG/ML INJ: 10.0000 mg | INTRAVENOUS | Status: DC | PRN

## 2012-11-12 MED ORDER — GENTAMICIN SULFATE 40 MG/ML IJ SOLN
160.0000 mg | Freq: Three times a day (TID) | INTRAVENOUS | Status: DC
  Administered 2012-11-12 – 2012-11-13 (×2): 160 mg via INTRAVENOUS
  Filled 2012-11-12 (×3): qty 4

## 2012-11-12 MED ORDER — PENICILLIN G POTASSIUM 5000000 UNITS IJ SOLR
2.5000 10*6.[IU] | INTRAMUSCULAR | Status: DC
  Administered 2012-11-12 – 2012-11-13 (×6): 2.5 10*6.[IU] via INTRAVENOUS
  Filled 2012-11-12 (×8): qty 2.5

## 2012-11-12 MED ORDER — ACETAMINOPHEN 325 MG PO TABS: 650.0000 mg | ORAL_TABLET | ORAL | Status: DC | PRN

## 2012-11-12 MED ORDER — OXYTOCIN BOLUS FROM INFUSION: 500.0000 mL | INTRAVENOUS | Status: DC

## 2012-11-12 MED ORDER — PHENYLEPHRINE 40 MCG/ML (10ML) SYRINGE FOR IV PUSH (FOR BLOOD PRESSURE SUPPORT): 80.0000 ug | PREFILLED_SYRINGE | INTRAVENOUS | Status: DC | PRN

## 2012-11-12 MED ORDER — ONDANSETRON HCL 4 MG/2ML IJ SOLN
4.0000 mg | Freq: Four times a day (QID) | INTRAMUSCULAR | Status: DC | PRN
  Administered 2012-11-12: 4 mg via INTRAVENOUS
  Filled 2012-11-12: qty 2

## 2012-11-12 MED ORDER — TERBUTALINE SULFATE 1 MG/ML IJ SOLN: 0.2500 mg | Freq: Once | INTRAMUSCULAR | Status: AC | PRN

## 2012-11-12 MED ORDER — DIPHENHYDRAMINE HCL 50 MG/ML IJ SOLN: 12.5000 mg | INTRAMUSCULAR | Status: DC | PRN

## 2012-11-12 MED ORDER — CITRIC ACID-SODIUM CITRATE 334-500 MG/5ML PO SOLN
30.0000 mL | ORAL | Status: DC | PRN
  Administered 2012-11-13: 30 mL via ORAL
  Filled 2012-11-12: qty 15

## 2012-11-12 MED ORDER — LIDOCAINE HCL (PF) 1 % IJ SOLN
30.0000 mL | INTRAMUSCULAR | Status: AC | PRN
  Administered 2012-11-12 (×2): 9 mL via SUBCUTANEOUS

## 2012-11-12 MED ORDER — OXYTOCIN 40 UNITS IN LACTATED RINGERS INFUSION - SIMPLE MED: 62.5000 mL/h | INTRAVENOUS | Status: DC

## 2012-11-12 MED ORDER — LACTATED RINGERS IV SOLN
500.0000 mL | Freq: Once | INTRAVENOUS | Status: AC
  Administered 2012-11-12: 500 mL via INTRAVENOUS

## 2012-11-12 MED ORDER — OXYTOCIN 40 UNITS IN LACTATED RINGERS INFUSION - SIMPLE MED
1.0000 m[IU]/min | INTRAVENOUS | Status: DC
  Administered 2012-11-12: 1 m[IU]/min via INTRAVENOUS
  Administered 2012-11-12: 12 m[IU]/min via INTRAVENOUS
  Administered 2012-11-12: 11 m[IU]/min via INTRAVENOUS
  Filled 2012-11-12: qty 1000

## 2012-11-12 MED ORDER — FENTANYL 2.5 MCG/ML BUPIVACAINE 1/10 % EPIDURAL INFUSION (WH - ANES)
14.0000 mL/h | INTRAMUSCULAR | Status: DC | PRN
  Administered 2012-11-12 – 2012-11-13 (×2): 14 mL/h via EPIDURAL
  Filled 2012-11-12 (×3): qty 125

## 2012-11-12 MED ORDER — EPHEDRINE 5 MG/ML INJ
10.0000 mg | INTRAVENOUS | Status: DC | PRN
  Filled 2012-11-12: qty 4

## 2012-11-12 MED ORDER — PENICILLIN G POTASSIUM 5000000 UNITS IJ SOLR
5.0000 10*6.[IU] | Freq: Once | INTRAVENOUS | Status: AC
  Administered 2012-11-12: 5 10*6.[IU] via INTRAVENOUS
  Filled 2012-11-12: qty 5

## 2012-11-12 MED ORDER — IBUPROFEN 600 MG PO TABS: 600.0000 mg | ORAL_TABLET | Freq: Four times a day (QID) | ORAL | Status: DC | PRN

## 2012-11-12 MED ORDER — PHENYLEPHRINE 40 MCG/ML (10ML) SYRINGE FOR IV PUSH (FOR BLOOD PRESSURE SUPPORT)
80.0000 ug | PREFILLED_SYRINGE | INTRAVENOUS | Status: DC | PRN
  Filled 2012-11-12: qty 5

## 2012-11-12 MED ORDER — LACTATED RINGERS IV SOLN: 500.0000 mL | INTRAVENOUS | Status: DC | PRN

## 2012-11-12 MED ORDER — LACTATED RINGERS IV SOLN
INTRAVENOUS | Status: DC
  Administered 2012-11-12 – 2012-11-13 (×6): via INTRAVENOUS

## 2012-11-12 MED ORDER — OXYCODONE-ACETAMINOPHEN 5-325 MG PO TABS: 1.0000 | ORAL_TABLET | ORAL | Status: DC | PRN

## 2012-11-12 NOTE — MAU Note (Signed)
Pt states constant trickling of clear fluid since 12pm. Denies vaginal bleeding. States good FM

## 2012-11-12 NOTE — Anesthesia Preprocedure Evaluation (Signed)
Anesthesia Evaluation  Patient identified by MRN, date of birth, ID band Patient awake    Reviewed: Allergy & Precautions, H&P , NPO status , Patient's Chart, lab work & pertinent test results  Airway Mallampati: III TM Distance: >3 FB Neck ROM: full    Dental no notable dental hx.    Pulmonary    Pulmonary exam normal       Cardiovascular negative cardio ROS      Neuro/Psych negative psych ROS   GI/Hepatic negative GI ROS, Neg liver ROS,   Endo/Other  Morbid obesity  Renal/GU negative Renal ROS     Musculoskeletal   Abdominal (+) + obese,   Peds  Hematology negative hematology ROS (+)   Anesthesia Other Findings   Reproductive/Obstetrics (+) Pregnancy                           Anesthesia Physical Anesthesia Plan  ASA: III  Anesthesia Plan: Epidural   Post-op Pain Management:    Induction:   Airway Management Planned:   Additional Equipment:   Intra-op Plan:   Post-operative Plan:   Informed Consent: I have reviewed the patients History and Physical, chart, labs and discussed the procedure including the risks, benefits and alternatives for the proposed anesthesia with the patient or authorized representative who has indicated his/her understanding and acceptance.     Plan Discussed with:   Anesthesia Plan Comments:         Anesthesia Quick Evaluation

## 2012-11-12 NOTE — Progress Notes (Signed)
  Subjective: Requesting IV pain medication.  Breathing with contractions.  Objective: BP 125/73  Pulse 91  Temp(Src) 98.5 F (36.9 C) (Oral)  Resp 20  Ht 5' (1.524 m)  Wt 111.494 kg (245 lb 12.8 oz)  BMI 48 kg/m2  SpO2 100%  LMP 02/25/2012      FHT:  Category 1 UC:   regular, every 3 minutes SVE:   Dilation: Fingertip Effacement (%): 80 Station: -2 Exam by:: V. Carlena Ruybal CNM Vtx presentation verified by BS US--vtx -3 station. Pitocin on 7 mu/min  Assessment / Plan: TOLAC PROM at term. Early labor GBS positive  Plan: Stadol IV now--epidural prn Continue pitocin augmentation.   Place IUPC when cervix will accommodate.  Nigel Bridgeman 11/12/2012, 12:56 PM

## 2012-11-12 NOTE — Progress Notes (Signed)
  Subjective: Pt requested to be checked.  Pt reports getting some sleep, but was awakened by UC.  Objective: BP 107/91  Pulse 91  Temp(Src) 98 F (36.7 C) (Oral)  Resp 20  Ht 5' (1.524 m)  Wt 245 lb 12.8 oz (111.494 kg)  BMI 48 kg/m2  SpO2 100%  LMP 02/25/2012 I/O last 3 completed shifts: In: -  Out: 150 [Urine:150]    FHT:  Cat I UC:   regular, every 2-4 minutes  SVE:   Dilation: 2 Effacement (%): 50 Station: -2 Exam by:: Haroldine Laws, CNM   Assessment / Plan:  Labor: IOL for prolonged PROM; Pitocin @ 12 milliU; MVUs @ 125 Preeclampsia: no s/s Fetal Wellbeing: Cat I Pain Control: Epidural I/D: GBS prophylaxis - PCN; Gentamicin for prolonged rupture; Afibrile Anticipated MOD: SVD   Sherri [name] 11/12/2012, 11:07 PM

## 2012-11-12 NOTE — Progress Notes (Signed)
  Subjective: Comfortable with epidural.  Had gush of large amount clear fluid at 4:30pm.  Objective: BP 113/62  Pulse 79  Temp(Src) 98.3 F (36.8 C) (Oral)  Resp 20  Ht 5' (1.524 m)  Wt 111.494 kg (245 lb 12.8 oz)  BMI 48 kg/m2  SpO2 100%  LMP 02/25/2012      FHT: Category 1 UC:   regular, every 3 minutes Pitocin on 7 mu/min SVE:   Dilation: 1 Effacement (%): 90 Station: -3 Exam by:: Manfred Arch CNM [place] noted, vtx very high. AROM with exam, with descent of vtx to -1/-2 station and much better applied. IUPC placed.  Reports hx of cryosurgery approx 15 years ago.  Assessment / Plan: TOLAC Early labor Prolonged ROM at term Continue pitocin augmentation.  Nigel Bridgeman 11/12/2012, 5p

## 2012-11-12 NOTE — Progress Notes (Signed)
  Subjective: Comfortable with epidural.  Objective: BP 113/62  Pulse 79  Temp(Src) 98.3 F (36.8 C) (Oral)  Resp 20  Ht 5' (1.524 m)  Wt 111.494 kg (245 lb 12.8 oz)  BMI 48 kg/m2  SpO2 100%  LMP 02/25/2012      FHT:  Category 1 UC:   q 2-4 1/2 min, MVUs 150 Pitocin on 8 mu/min SVE: Deferred at present  Assessment / Plan: TOLAC Prolonged ROM--now approx 30 hours. GBS positive--on ATB Will continue to observe and augment as long as maternal/fetal status remain stable.  Sherri [name] 11/12/2012, 6:18 PM

## 2012-11-12 NOTE — Progress Notes (Signed)
ANTIBIOTIC CONSULT NOTE - INITIAL  Pharmacy Consult for Gentamicin Indication: PROM > 30 hours and positive GBS  No Known Allergies  Patient Measurements: Height: 5' (152.4 cm) Weight: 245 lb 12.8 oz (111.494 kg) IBW/kg (Calculated) : 45.5 Adjusted Body Weight: 65.3kg  Vital Signs: Temp: 98 F (36.7 C) (08/19 1931) Temp src: Oral (08/19 1931) BP: 118/56 mmHg (08/19 2001) Pulse Rate: 84 (08/19 2001)  Labs:  Recent Labs  11/12/12 0423  WBC 6.7  HGB 12.1  PLT 161   No results found for this basename: GENTTROUGH, GENTPEAK, GENTRANDOM,  in the last 72 hours   Microbiology: Recent Results (from the past 720 hour(s))  OB RESULTS CONSOLE GBS     Status: None   Collection Time    11/04/12 12:00 AM      Result Value Range Status   GBS Positive   Final    Medications:  Penicillin 5 mu x 1 then 2.5 mu IV every 4 hours.  Assessment: 32 y.o. female G3P2001 at [redacted]w[redacted]d with PROM > 30 hours and GBS+ Estimated Ke = 0.253, Vd = 0.4L/kg  Goal of Therapy:  Gentamicin peak 6-8 mg/L and Trough < 1.5 mg/L  Plan:  Gentamicin 160 mg IV every 8 hrs  Check Scr with next labs if gentamicin continued. Will check gentamicin levels if continued > 72hr or clinically indicated.  Hurley Cisco 11/12/2012,9:09 PM

## 2012-11-12 NOTE — Anesthesia Procedure Notes (Signed)
Epidural Patient location during procedure: OB Start time: 11/12/2012 3:36 PM End time: 11/12/2012 3:40 PM  Staffing Anesthesiologist: Sandrea Hughs Performed by: anesthesiologist   Preanesthetic Checklist Completed: patient identified, surgical consent, pre-op evaluation, timeout performed, IV checked, risks and benefits discussed and monitors and equipment checked  Epidural Patient position: sitting Prep: site prepped and draped and DuraPrep Patient monitoring: continuous pulse ox and blood pressure Approach: midline Injection technique: LOR air  Needle:  Needle type: Tuohy  Needle gauge: 17 G Needle length: 9 cm and 9 Needle insertion depth: 7 cm Catheter type: closed end flexible Catheter size: 19 Gauge Catheter at skin depth: 13 cm Test dose: negative and Other  Assessment Sensory level: T9 Events: blood not aspirated, injection not painful, no injection resistance, negative IV test and no paresthesia  Additional Notes Reason for block:procedure for pain

## 2012-11-12 NOTE — Progress Notes (Signed)
  Subjective: Resting in bed--mother and FOB (sleeping) at bedside.  Aware of moderate contractions, but still irregular quality.  Objective: BP 98/73  Pulse 100  Temp(Src) 98 F (36.7 C) (Oral)  Resp 18  Ht 5' (1.524 m)  Wt 111.494 kg (245 lb 12.8 oz)  BMI 48 kg/m2  SpO2 100%  LMP 02/25/2012      FHT:  Category 1 UC:   irregular, every 2-5 minutes SVE:   Deferred at present (closed on exam by Haroldine Laws, CNM, at 4am.  Vtx presentation verified by BS [place] at 5:41am prior to initiation of pitocin. Pitocin on 4 mu/min.  Assessment / Plan: PROM at term, early labor GBS +--on PCN. Pitocin begun at 6am Reviewed again R&B of VBAC with patient and family, including failure of trial of labor, risk of uterine rupture, risk of pp hemorrhage due to previous hx of PPH, increased risk of uterine rupture with pitocin augmentation.  Patient and family seem to understand these risks and wish to proceed with trial of labor. Patient plans epidural as labor progresses. Plan IUPC placement when cervical dilation permits.  Nigel Bridgeman 11/12/2012, 8:30am

## 2012-11-12 NOTE — Progress Notes (Signed)
  Subjective: Patient requesting epidural--UCs same quality/intensity, with minimal benefit from Stadol at 1:05p.  Objective: BP 152/89  Pulse 77  Temp(Src) 97.9 F (36.6 C) (Oral)  Resp 20  Ht 5' (1.524 m)  Wt 111.494 kg (245 lb 12.8 oz)  BMI 48 kg/m2  SpO2 100%  LMP 02/25/2012      FHT: Category 1 UC:   regular, every 3 minutes SVE:  Deferred at present due to prolonged ROM--no evidence of imminent delivery  Assessment / Plan: TOLAC Early labor Will place epidural, then re-evaluate cervix.  Sherri [name] 11/12/2012, 3:10 PM

## 2012-11-12 NOTE — H&P (Signed)
Sherri [name] is a 32 y.o. female, G3P2001 at [redacted]w[redacted]d, presenting for PROM.  Denies VB, UCs, recent fever, resp or GI c/o's, UTI or PIH s/s. GFM. Desires epidural.  In 2002 the patient had a term pregnancy with a fetal demise of uncertain etiology. No autopsy was performed.   The patient had a cesarean section in 2006 after a failed induction and then prolonged rupture of membranes. She had a large blood loss requiring a blood transfusion.   Patient Active Problem List   Diagnosis Date Noted  . Migraine 11/12/2012  . 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 Prior pregnancy with fetal demise in third trimester 11/12/2012  . Obesity 05/09/2012  . H/O cesarean section 05/09/2012  . Prior pregnancy with fetal demise and current pregnancy 05/09/2012  . High-risk pregnancy 05/09/2012  . Asthma 05/09/2012  . Rash and nonspecific skin eruption 12/01/2010  . Screening for cervical cancer 06/17/2010  . ALLERGIC RHINITIS 07/03/2008    History of present pregnancy: Patient entered care at 8 weeks.   EDC of 12/01/12 was established by LMP.   Anatomy scan:  19 weeks, limited with normal findings and an anterior placenta.   Additional [place] evaluations:  Anatomy scan f/u - Completed and WNL.  @ [redacted]w[redacted]d for growth S>D - EFW 87th%ile and AFI 85th%ile. Significant prenatal events:  Short term disability papers for Sciatica   Last evaluation:  11/11/12 at [redacted]w[redacted]d   0cm / 50% / -4  OB History   Grav Para Term Preterm Abortions TAB SAB Ect Mult Living   3 2 2       1      Obstetric Comments   2004 FAILED INDUCTION;  PROLONGED ROM; C/S; PP BLOOD TRANSFUSION DUE TO BLOOD LOSS DURING C/S; PP ENDOMETRITIS; HOSPITALIZED FOR 4 WEEKS AT [facility]     Past Medical History  Diagnosis Date  . Blood transfusion without reported diagnosis     PP 2006  . Abnormal Pap smear AGE 61     COLPO; LAST PAP 2012  . Infection 2002    CHLAMYDIA  . Infection 2002    GC  . Infection 2004    PP ENDOMETRITIS  . Anemia 2004    PP    Past Surgical History  Procedure Laterality Date  . Cesarean section  2004  . Cholecystectomy  2005  . Wisdom tooth extraction  AS TEEN   Family History: family history includes Arthritis in her father; COPD in her father; Cancer in her father; Diabetes in her mother; Heart disease in her mother; Hypertension in her father. Social History:  reports that she quit smoking about 2 years ago. She has never used smokeless tobacco. She reports that  drinks alcohol. She reports that she uses illicit drugs about twice per week.   Prenatal Transfer Tool  Maternal Diabetes: No Genetic Screening: Normal Maternal Ultrasounds/Referrals: Normal Fetal Ultrasounds or other Referrals:  None Maternal Substance Abuse:  No Significant Maternal Medications:  None Significant Maternal Lab Results: Lab values include: Group B Strep positive    ROS: see HPI above, all other systems are negative  No Known Allergies   Dilation: Closed Effacement (%): 80 Station: -2 Exam by:: J Denelle Capurro CNM Blood pressure 121/63, pulse 116, temperature 98.2 F (36.8 C), temperature source Oral, resp. rate 18, height 5' (1.524 m), weight 245 lb 12.8 oz (111.494 kg), last menstrual period 02/25/2012, SpO2 100.00%.  Chest clear Heart RRR without murmur Abd gravid, NT Ext: WNL  FHR: reassuring, will cto UCs:  Q 2-5 min  Prenatal labs: ABO, Rh: O/POS/-- (01/27 1610) Antibody: NEG (01/27 0938) Rubella:   Immune RPR: NON REAC (01/27 0938)  HBsAg: NEGATIVE (01/27 0938)  HIV: NON REACTIVE (01/27 9604)  GBS:  Positive Sickle cell/Hgb electrophoresis:  Normal study Pap:  05/09/12 WNL GC:  Neg Chlamydia:  Neg Genetic screenings:  1st trimester screen - WNL Glucola:  129 Other:  n/a   Assessment/Plan: IUP at [redacted]w[redacted]d PROM x > 12 hours GBS pos Desires VBAC Desires epidural  Admit to BS per c/w Dr. Dion Body as attending MD Routine [facility] admission orders GBS prophylaxis per protocol Epidural prn  Rowan Blase, MSN 11/12/2012, 5:03 AM

## 2012-11-13 ENCOUNTER — Encounter (HOSPITAL_COMMUNITY)
Admission: AD | Disposition: A | Discharge status: Home / Self Care | Payer: Self-pay | Source: Ambulatory Visit | Attending: Obstetrics and Gynecology

## 2012-11-13 ENCOUNTER — Encounter (HOSPITAL_COMMUNITY): Payer: Self-pay | Admitting: Anesthesiology

## 2012-11-13 LAB — CBC
HCT: 33.7 % — ABNORMAL LOW (ref 36.0–46.0)
Hemoglobin: 11.5 g/dL — ABNORMAL LOW (ref 12.0–15.0)
WBC: 8.3 10*3/uL (ref 4.0–10.5)

## 2012-11-13 SURGERY — Surgical Case
Anesthesia: Epidural | Site: Abdomen | Wound class: Clean Contaminated

## 2012-11-13 MED ORDER — MEPERIDINE HCL 25 MG/ML IJ SOLN
INTRAMUSCULAR | Status: AC
  Filled 2012-11-13: qty 1

## 2012-11-13 MED ORDER — METHYLERGONOVINE MALEATE 0.2 MG/ML IJ SOLN
INTRAMUSCULAR | Status: AC
  Filled 2012-11-13: qty 1

## 2012-11-13 MED ORDER — HYDROMORPHONE HCL PF 1 MG/ML IJ SOLN: 0.2500 mg | INTRAMUSCULAR | Status: DC | PRN

## 2012-11-13 MED ORDER — ZOLPIDEM TARTRATE 5 MG PO TABS: 5.0000 mg | ORAL_TABLET | Freq: Every evening | ORAL | Status: DC | PRN

## 2012-11-13 MED ORDER — OXYTOCIN 10 UNIT/ML IJ SOLN
INTRAMUSCULAR | Status: AC
  Filled 2012-11-13: qty 4

## 2012-11-13 MED ORDER — FERROUS SULFATE 325 (65 FE) MG PO TABS
325.0000 mg | ORAL_TABLET | Freq: Two times a day (BID) | ORAL | Status: DC
  Administered 2012-11-13 – 2012-11-16 (×2): 325 mg via ORAL
  Filled 2012-11-13 (×3): qty 1

## 2012-11-13 MED ORDER — KETOROLAC TROMETHAMINE 30 MG/ML IJ SOLN: 30.0000 mg | Freq: Four times a day (QID) | INTRAMUSCULAR | Status: DC | PRN

## 2012-11-13 MED ORDER — LACTATED RINGERS IV SOLN
INTRAVENOUS | Status: DC | PRN
  Administered 2012-11-13: 09:00:00 via INTRAVENOUS

## 2012-11-13 MED ORDER — OXYTOCIN 10 UNIT/ML IJ SOLN
40.0000 [IU] | INTRAVENOUS | Status: DC | PRN
  Administered 2012-11-13: 40 [IU] via INTRAVENOUS

## 2012-11-13 MED ORDER — ONDANSETRON HCL 4 MG PO TABS: 4.0000 mg | ORAL_TABLET | ORAL | Status: DC | PRN

## 2012-11-13 MED ORDER — NALOXONE HCL 0.4 MG/ML IJ SOLN: 0.4000 mg | INTRAMUSCULAR | Status: DC | PRN

## 2012-11-13 MED ORDER — LANOLIN HYDROUS EX OINT: 1.0000 "application " | TOPICAL_OINTMENT | CUTANEOUS | Status: DC | PRN

## 2012-11-13 MED ORDER — ONDANSETRON HCL 4 MG/2ML IJ SOLN
INTRAMUSCULAR | Status: AC
  Filled 2012-11-13: qty 2

## 2012-11-13 MED ORDER — OXYCODONE-ACETAMINOPHEN 5-325 MG PO TABS
1.0000 | ORAL_TABLET | ORAL | Status: DC | PRN
  Administered 2012-11-13: 2 via ORAL
  Administered 2012-11-14 – 2012-11-15 (×4): 1 via ORAL
  Administered 2012-11-15: 2 via ORAL
  Filled 2012-11-13 (×4): qty 1
  Filled 2012-11-13: qty 2
  Filled 2012-11-13 (×2): qty 1

## 2012-11-13 MED ORDER — MEPERIDINE HCL 25 MG/ML IJ SOLN: 6.2500 mg | INTRAMUSCULAR | Status: DC | PRN

## 2012-11-13 MED ORDER — KETOROLAC TROMETHAMINE 60 MG/2ML IM SOLN
60.0000 mg | Freq: Once | INTRAMUSCULAR | Status: AC | PRN
  Administered 2012-11-13: 60 mg via INTRAMUSCULAR

## 2012-11-13 MED ORDER — DIPHENHYDRAMINE HCL 50 MG/ML IJ SOLN: 25.0000 mg | INTRAMUSCULAR | Status: DC | PRN

## 2012-11-13 MED ORDER — NALOXONE HCL 1 MG/ML IJ SOLN
1.0000 ug/kg/h | INTRAVENOUS | Status: DC | PRN
  Filled 2012-11-13: qty 2

## 2012-11-13 MED ORDER — PRENATAL MULTIVITAMIN CH
1.0000 | ORAL_TABLET | Freq: Every day | ORAL | Status: DC
  Administered 2012-11-14 – 2012-11-15 (×2): 1 via ORAL
  Filled 2012-11-13 (×2): qty 1

## 2012-11-13 MED ORDER — DIPHENHYDRAMINE HCL 25 MG PO CAPS: 25.0000 mg | ORAL_CAPSULE | Freq: Four times a day (QID) | ORAL | Status: DC | PRN

## 2012-11-13 MED ORDER — DIPHENHYDRAMINE HCL 25 MG PO CAPS
25.0000 mg | ORAL_CAPSULE | ORAL | Status: DC | PRN
  Filled 2012-11-13: qty 1

## 2012-11-13 MED ORDER — OXYTOCIN 40 UNITS IN LACTATED RINGERS INFUSION - SIMPLE MED: 62.5000 mL/h | INTRAVENOUS | Status: AC

## 2012-11-13 MED ORDER — ONDANSETRON HCL 4 MG/2ML IJ SOLN: 4.0000 mg | INTRAMUSCULAR | Status: DC | PRN

## 2012-11-13 MED ORDER — FLEET ENEMA 7-19 GM/118ML RE ENEM: 1.0000 | ENEMA | Freq: Every day | RECTAL | Status: DC | PRN

## 2012-11-13 MED ORDER — SIMETHICONE 80 MG PO CHEW
80.0000 mg | CHEWABLE_TABLET | Freq: Three times a day (TID) | ORAL | Status: DC
  Administered 2012-11-13 – 2012-11-16 (×7): 80 mg via ORAL

## 2012-11-13 MED ORDER — SENNOSIDES-DOCUSATE SODIUM 8.6-50 MG PO TABS
2.0000 | ORAL_TABLET | Freq: Every day | ORAL | Status: DC
  Administered 2012-11-13 – 2012-11-15 (×3): 2 via ORAL

## 2012-11-13 MED ORDER — FENTANYL CITRATE 0.05 MG/ML IJ SOLN
INTRAMUSCULAR | Status: DC | PRN
  Administered 2012-11-13 (×2): 50 ug via INTRAVENOUS

## 2012-11-13 MED ORDER — SIMETHICONE 80 MG PO CHEW: 80.0000 mg | CHEWABLE_TABLET | ORAL | Status: DC | PRN

## 2012-11-13 MED ORDER — NALBUPHINE HCL 10 MG/ML IJ SOLN
5.0000 mg | INTRAMUSCULAR | Status: DC | PRN
Start: 2012-11-13 — End: 2012-11-13
  Filled 2012-11-13: qty 1

## 2012-11-13 MED ORDER — SCOPOLAMINE 1 MG/3DAYS TD PT72: 1.0000 | MEDICATED_PATCH | Freq: Once | TRANSDERMAL | Status: DC

## 2012-11-13 MED ORDER — LIDOCAINE-EPINEPHRINE 2 %-1:100000 IJ SOLN
INTRAMUSCULAR | Status: DC | PRN
  Administered 2012-11-13 (×6): 5 mL via INTRADERMAL

## 2012-11-13 MED ORDER — MORPHINE SULFATE (PF) 0.5 MG/ML IJ SOLN
INTRAMUSCULAR | Status: DC | PRN
  Administered 2012-11-13: 1 mg via INTRAVENOUS

## 2012-11-13 MED ORDER — METOCLOPRAMIDE HCL 5 MG/ML IJ SOLN: 10.0000 mg | Freq: Three times a day (TID) | INTRAMUSCULAR | Status: DC | PRN

## 2012-11-13 MED ORDER — FENTANYL CITRATE 0.05 MG/ML IJ SOLN
INTRAMUSCULAR | Status: AC
  Filled 2012-11-13: qty 2

## 2012-11-13 MED ORDER — MORPHINE SULFATE 0.5 MG/ML IJ SOLN
INTRAMUSCULAR | Status: AC
  Filled 2012-11-13: qty 10

## 2012-11-13 MED ORDER — METHYLERGONOVINE MALEATE 0.2 MG/ML IJ SOLN
INTRAMUSCULAR | Status: DC | PRN
  Administered 2012-11-13: 0.2 mg via INTRAMUSCULAR

## 2012-11-13 MED ORDER — DIPHENHYDRAMINE HCL 50 MG/ML IJ SOLN: 12.5000 mg | INTRAMUSCULAR | Status: DC | PRN

## 2012-11-13 MED ORDER — BISACODYL 10 MG RE SUPP: 10.0000 mg | Freq: Every day | RECTAL | Status: DC | PRN

## 2012-11-13 MED ORDER — KETOROLAC TROMETHAMINE 60 MG/2ML IM SOLN
INTRAMUSCULAR | Status: AC
  Filled 2012-11-13: qty 2

## 2012-11-13 MED ORDER — TETANUS-DIPHTH-ACELL PERTUSSIS 5-2.5-18.5 LF-MCG/0.5 IM SUSP
0.5000 mL | Freq: Once | INTRAMUSCULAR | Status: AC
  Administered 2012-11-14: 0.5 mL via INTRAMUSCULAR

## 2012-11-13 MED ORDER — ONDANSETRON HCL 4 MG/2ML IJ SOLN
4.0000 mg | Freq: Three times a day (TID) | INTRAMUSCULAR | Status: DC | PRN
  Administered 2012-11-13: 4 mg via INTRAVENOUS
  Filled 2012-11-13: qty 2

## 2012-11-13 MED ORDER — NALBUPHINE HCL 10 MG/ML IJ SOLN
5.0000 mg | INTRAMUSCULAR | Status: DC | PRN
  Filled 2012-11-13: qty 1

## 2012-11-13 MED ORDER — MENTHOL 3 MG MT LOZG: 1.0000 | LOZENGE | OROMUCOSAL | Status: DC | PRN

## 2012-11-13 MED ORDER — LACTATED RINGERS IV SOLN
INTRAVENOUS | Status: DC
  Administered 2012-11-13: 14:00:00 via INTRAVENOUS

## 2012-11-13 MED ORDER — MEPERIDINE HCL 25 MG/ML IJ SOLN
INTRAMUSCULAR | Status: DC | PRN
  Administered 2012-11-13: 12.5 mg via INTRAVENOUS

## 2012-11-13 MED ORDER — DIBUCAINE 1 % RE OINT: 1.0000 "application " | TOPICAL_OINTMENT | RECTAL | Status: DC | PRN

## 2012-11-13 MED ORDER — ONDANSETRON HCL 4 MG/2ML IJ SOLN
INTRAMUSCULAR | Status: DC | PRN
  Administered 2012-11-13: 4 mg via INTRAVENOUS

## 2012-11-13 MED ORDER — IBUPROFEN 600 MG PO TABS
600.0000 mg | ORAL_TABLET | Freq: Four times a day (QID) | ORAL | Status: DC
  Administered 2012-11-13 – 2012-11-16 (×12): 600 mg via ORAL
  Filled 2012-11-13 (×12): qty 1

## 2012-11-13 MED ORDER — SODIUM BICARBONATE 8.4 % IV SOLN
INTRAVENOUS | Status: AC
  Filled 2012-11-13: qty 50

## 2012-11-13 MED ORDER — MORPHINE SULFATE (PF) 0.5 MG/ML IJ SOLN
INTRAMUSCULAR | Status: DC | PRN
  Administered 2012-11-13: 4 mg via EPIDURAL

## 2012-11-13 MED ORDER — SODIUM CHLORIDE 0.9 % IJ SOLN: 3.0000 mL | INTRAMUSCULAR | Status: DC | PRN

## 2012-11-13 MED ORDER — MEASLES, MUMPS & RUBELLA VAC ~~LOC~~ INJ
0.5000 mL | INJECTION | Freq: Once | SUBCUTANEOUS | Status: DC
  Filled 2012-11-13: qty 0.5

## 2012-11-13 MED ORDER — WITCH HAZEL-GLYCERIN EX PADS: 1.0000 "application " | MEDICATED_PAD | CUTANEOUS | Status: DC | PRN

## 2012-11-13 MED ORDER — LIDOCAINE-EPINEPHRINE (PF) 2 %-1:200000 IJ SOLN
INTRAMUSCULAR | Status: AC
  Filled 2012-11-13: qty 20

## 2012-11-13 SURGICAL SUPPLY — 42 items
APL SKNCLS STERI-STRIP NONHPOA (GAUZE/BANDAGES/DRESSINGS) ×1
BENZOIN TINCTURE PRP APPL 2/3 (GAUZE/BANDAGES/DRESSINGS) ×2 IMPLANT
CLAMP CORD UMBIL (MISCELLANEOUS) IMPLANT
CLOTH BEACON ORANGE TIMEOUT ST (SAFETY) ×2 IMPLANT
CONTAINER PREFILL 10% NBF 15ML (MISCELLANEOUS) IMPLANT
DRAIN JACKSON PRT FLT 10 (DRAIN) IMPLANT
DRAPE LG THREE QUARTER DISP (DRAPES) ×2 IMPLANT
DRSG OPSITE POSTOP 4X10 (GAUZE/BANDAGES/DRESSINGS) ×2 IMPLANT
DURAPREP 26ML APPLICATOR (WOUND CARE) ×2 IMPLANT
ELECT REM PT RETURN 9FT ADLT (ELECTROSURGICAL) ×2
ELECTRODE REM PT RTRN 9FT ADLT (ELECTROSURGICAL) ×1 IMPLANT
EVACUATOR SILICONE 100CC (DRAIN) IMPLANT
EXTRACTOR VACUUM M CUP 4 TUBE (SUCTIONS) IMPLANT
GLOVE BIO SURGEON STRL SZ 6.5 (GLOVE) ×2 IMPLANT
GLOVE BIOGEL PI IND STRL 7.0 (GLOVE) ×1 IMPLANT
GLOVE BIOGEL PI INDICATOR 7.0 (GLOVE) ×1
GOWN STRL REIN XL XLG (GOWN DISPOSABLE) ×4 IMPLANT
HEMOSTAT SURGICEL 4X8 (HEMOSTASIS) ×1 IMPLANT
KIT ABG SYR 3ML LUER SLIP (SYRINGE) ×2 IMPLANT
NDL HYPO 25X5/8 SAFETYGLIDE (NEEDLE) IMPLANT
NEEDLE HYPO 25X5/8 SAFETYGLIDE (NEEDLE) IMPLANT
NS IRRIG 1000ML POUR BTL (IV SOLUTION) ×2 IMPLANT
PACK C SECTION WH (CUSTOM PROCEDURE TRAY) ×2 IMPLANT
PAD OB MATERNITY 4.3X12.25 (PERSONAL CARE ITEMS) ×2 IMPLANT
RTRCTR C-SECT PINK 25CM LRG (MISCELLANEOUS) IMPLANT
STAPLER VISISTAT 35W (STAPLE) IMPLANT
STRIP CLOSURE SKIN 1/2X4 (GAUZE/BANDAGES/DRESSINGS) ×2 IMPLANT
SUT CHROMIC 0 CT 1 (SUTURE) ×3 IMPLANT
SUT MNCRL AB 3-0 PS2 27 (SUTURE) ×1 IMPLANT
SUT PLAIN 2 0 (SUTURE)
SUT PLAIN 2 0 XLH (SUTURE) ×2 IMPLANT
SUT PLAIN ABS 2-0 CT1 27XMFL (SUTURE) ×2 IMPLANT
SUT SILK 2 0 SH (SUTURE) IMPLANT
SUT VIC AB 0 CT1 27 (SUTURE) ×4
SUT VIC AB 0 CT1 27XBRD ANBCTR (SUTURE) IMPLANT
SUT VIC AB 0 CTX 36 (SUTURE) ×10
SUT VIC AB 0 CTX36XBRD ANBCTRL (SUTURE) ×4 IMPLANT
SUT VIC AB 2-0 CT1 27 (SUTURE) ×2
SUT VIC AB 2-0 CT1 TAPERPNT 27 (SUTURE) IMPLANT
TOWEL OR 17X24 6PK STRL BLUE (TOWEL DISPOSABLE) ×2 IMPLANT
TRAY FOLEY CATH 14FR (SET/KITS/TRAYS/PACK) ×1 IMPLANT
WATER STERILE IRR 1000ML POUR (IV SOLUTION) ×2 IMPLANT

## 2012-11-13 NOTE — Transfer of Care (Signed)
Immediate Anesthesia Transfer of Care Note  Patient: Sherri [name]  Procedure(s) Performed: Procedure(s): CESAREAN SECTION (N/A)  Patient Location: PACU  Anesthesia Type:Epidural  Level of Consciousness: awake, alert  and oriented  Airway & Oxygen Therapy: Patient Spontanous Breathing  Post-op Assessment: Report given to PACU RN and Post -op Vital signs reviewed and stable  Post vital signs: Reviewed and stable  Complications: No apparent anesthesia complications

## 2012-11-13 NOTE — Anesthesia Postprocedure Evaluation (Signed)
Anesthesia Post Note  Patient: Sherri [name]  Procedure(s) Performed: Procedure(s) (LRB): CESAREAN SECTION (N/A)  Anesthesia type: Epidural  Patient location: PACU  Post pain: Pain level controlled  Post assessment: Post-op Vital signs reviewed  Last Vitals:  Filed Vitals:   11/13/12 1030  BP: 118/102  Pulse: 94  Temp: 37.1 C  Resp: 16    Post vital signs: Reviewed  Level of consciousness: awake  Complications: No apparent anesthesia complications

## 2012-11-13 NOTE — Progress Notes (Signed)
Hx uterine Atony.  Current uterine rupture, repaired during c section.

## 2012-11-13 NOTE — Progress Notes (Signed)
  Subjective: Pt request to end IOL and would like to do a c/s.  Dr. Richardson Dopp notified, OR can not accommodate at this time.  Objective: BP 113/56  Pulse 117  Temp(Src) 98.5 F (36.9 C) (Oral)  Resp 20  Ht 5' (1.524 m)  Wt 245 lb 12.8 oz (111.494 kg)  BMI 48 kg/m2  SpO2 100%  LMP 02/25/2012 I/O last 3 completed shifts: In: -  Out: 150 [Urine:150]    FHT:  Cat I UC:   regular, every 2-4 minutes  SVE:   Dilation: 2 Effacement (%): 50 Station: -2 Exam by:: Haroldine Laws, CNM   Assessment / Plan:  Pitocin turned off Epidural and abx continued  Sherri [name] 11/13/2012, 12:30 AM

## 2012-11-13 NOTE — Op Note (Signed)
Cesarean Section Procedure Note   Sherri [name]  11/12/2012 - 11/13/2012  Indications: PROLONGED LATENT PHASE AND MATERNAL REQUEST   Pre-operative Diagnosis: previous cesaraen section ruptured membranes. proloned latent phase and pt desires to have a repeat cesarean section.  She no longer wants to persue VBAC  Post-operative Diagnosis: same with uterine rupture  Surgeon: Surgeon(s) and Role:    * Michael Litter, MD - Primary    * Kathreen Cosier, MD - Assisting    * Hal Morales, MD - Assisting   Assistants: DR Gaynell Face AND DR HAYGOOD  Anesthesia: epidural   Procedure Details:  When I came on call at 7 am, I was informed the pt requested a CS at midnight, however the OR was busy and because the status was reassuring, it was to be done this morning.   The patient was seen in the Holding Room. The risks, benefits, complications, treatment options, and expected outcomes were discussed with the patient. The patient concurred with the proposed plan, giving informed consent. identified as Sherri [name] and the procedure verified as C-Section Delivery. A Time Out was held and the above information confirmed.  After induction of anesthesia, the patient was draped and prepped in the usual sterile manner. A transverse incision was made and carried down through the subcutaneous tissue to the fascia. Fascial incision was made in the midline and extended transversely. The fascia was separated from the underlying rectus muscle superiorly and inferiorly. The peritoneum was identified and entered. Upon entry into the perineum, it was difficult to identify the bladder.  The bladder was adherent to the uterus. it was noted that there was a uterine rupture with a layer of peritoneum and possibly bladdr and hematoma just underneath.  I called for help at this point and Dr Gaynell Face came in. In order to maintain the integrity of the bladder and because cisualization was poor beneath the  hematoma, I decided to do a classical incision on the uterus.  This was done with the scalpel.  I  Delivered from cephalic presentation an a  infant, with Apgar scores of 2 at one minute and 8 at five minutes. Cord ph was sent the umbilical cord was clamped and cut cord blood was obtained for evaluation. The placenta was removed Intact and appeared normal. The uterine outline was normal. There were peritoneal and omental adhesions around the uterus B were both tubes lie.I could see a portion of the tubes but not the ovaries.  The uterus was exteriorized.   The uterine incision was closed with running locked sutures of 0Vicryl. A second layer 0 vicrlyl was used to imbricate the uterine incision.  Several figure of eight suture were used to obtain hemostasis.  Dr Gaynell Face left and Dr Pennie Rushing came in to help.  Surgicel was placed along the uterine suture line.     Hemostasis was observed. Lavage was carried out until clear.  The peritoneum was closed with 0 chromic.  The muscles were examined and any bleeders were made hemostatic using bovie cautery device.   The fascia was then reapproximated with running sutures of 0 vicryl.  The subcutaneous tissue was reapproximated  With interrupted stitches using 2-0 plain gut. The subcuticular closure was performed using 3-27monocryl     Instrument, sponge, and needle counts were correct prior the abdominal closure and were correct at the conclusion of the case.    Findings: infant was delivered from VTX presentation. The fluid was CLEAR.  Pelvic and abdominal  adhesions as noted above   Estimated Blood Loss: 800cc  Total IV Fluids:   Urine Output: 400CC OF clear urine  Specimens: placenta to pathology   Complications: no complications  Disposition: PACU - hemodynamically stable.   Maternal Condition: stable   Baby condition / location:  NICU  Attending Attestation: I was present and scrubbed for the entire procedure.   Signed: Surgeon(s): Michael Litter, MD Kathreen Cosier, MD Hal Morales, MD

## 2012-11-13 NOTE — Lactation Note (Signed)
This note was copied from the chart of Sherri Barbaraann Rondo. Lactation Consultation Note  Initial consult with this mom of a NICU baby, now 20 1/2 hours old, and 37 3/[redacted] weeks gestation. i spoke to mom while she was doing skin to skin in the nicu, with her baby. I told mom that I heard from her nurse, that she may be interested in breast feeding. She said she breast fed her 10 year while in the hospital, but then she was ill with a uterine infection, and had to stop breast feeding. She decided to formula feed this baby, but as she was holding baby, he was rooting, and mom was willing to try latching. Unfortunately, he is NPO at this time. With mom's permission, I did some hand expression, and showed her that she has colostrum for her baby - small drops from each breast. Mom is going to call me if she decides to start pumping.  U[pdate at 1800 - mom never called for help with pumping.  Patient Name: Sherri [name] [id number] Date: 11/13/2012     Maternal Data    Feeding    LATCH Score/Interventions                      Lactation Tools Discussed/Used     Consult Status      Alfred Levins 11/13/2012, 6:19 PM

## 2012-11-14 ENCOUNTER — Encounter (HOSPITAL_COMMUNITY): Payer: Self-pay | Admitting: Obstetrics and Gynecology

## 2012-11-14 LAB — CBC
MCH: 30.4 pg (ref 26.0–34.0)
MCV: 89.2 fL (ref 78.0–100.0)
Platelets: 117 10*3/uL — ABNORMAL LOW (ref 150–400)
RDW: 14.6 % (ref 11.5–15.5)
WBC: 8.3 10*3/uL (ref 4.0–10.5)

## 2012-11-14 NOTE — Progress Notes (Signed)
CSW noted in MOB's chart that there was an admitted hx of illicit drug use.  CSW informed NNP and we are collecting for drug screens.  CSW contacted S. Lilliard/CNM for more information.  She reviewed the [facility], which noted that MOB admitted to Marijuana use prior to finding out she was pregnancy and stated her last use was 1/14.  CSW attempted to meet with MOB to complete assessment for NICU admission and hx of marijuana use, but she and her visitors were resting with the lights off.  CSW offered to return at a later time.  MOB states she is doing well.

## 2012-11-14 NOTE — Progress Notes (Signed)
I visited with [facility] and baby along with her mother in the NICU.  I celebrated with the family that their baby is doing better today.  I offered reflective listening as they told me about the birth experience and their fears during that time, especially given MOB's history of loss.  Family is processing it all well and has a strong support system.  I offered encouragement and blessings.  [address] [place] Pager, [phone] 11:58 AM   11/14/12 1100  Clinical Encounter Type  Visited With Patient and family together  Visit Type Spiritual support

## 2012-11-14 NOTE — Anesthesia Postprocedure Evaluation (Signed)
  Anesthesia Post-op Note  Anesthesia Post Note  Patient: Sherri [name]  Procedure(s) Performed: Procedure(s) (LRB): CESAREAN SECTION (N/A)  Anesthesia type: Epidural  Patient location: Womens Unit  Post pain: Pain level controlled  Post assessment: Post-op Vital signs reviewed  Last Vitals:  Filed Vitals:   11/14/12 0544  BP: 122/80  Pulse: 81  Temp: 36.4 C  Resp: 18    Post vital signs: Reviewed  Level of consciousness:alert  Complications: No apparent anesthesia complications

## 2012-11-15 DIAGNOSIS — D649 Anemia, unspecified: Secondary | ICD-10-CM | POA: Diagnosis present

## 2012-11-15 DIAGNOSIS — S3769XA Other injury of uterus, initial encounter: Secondary | ICD-10-CM

## 2012-11-15 NOTE — Progress Notes (Signed)
CSW attempted again to meet with MOB to complete assessment due to NICU admission and hx of marijuana use, but she was not in her room. CSW will attempt again at a later time.  

## 2012-11-15 NOTE — Progress Notes (Signed)
Post Partum Day 2 Subjective: Ambulating well, taking po's without difficulty, voiding well.  Visiting regularly in NICU  Objective: Blood pressure 126/82, pulse 86, temperature 98.1 F (36.7 C), temperature source Oral, resp. rate 18, height 5' (1.524 m), weight 245 lb (111.131 kg), last menstrual period 02/25/2012, SpO2 100.00%, Breastfeeding  Physical Exam:   Lungs clear Heart regular rate and rhythm Abdomen soft and nontender. Fundus firm and well below the umbilicus Incision dressing dry Lochia appropriate   Recent Labs  11/13/12 1015 11/14/12 0515  HGB 11.5* 10.1*  HCT 33.7* 29.6*    Assessment/Plan: Stable Pt requests discharge home on 11/17/12   LOS: 3 days   HAYGOOD,VANESSA P 11/15/2012, 10:38 AM

## 2012-11-15 NOTE — Progress Notes (Signed)
Clinical Social Work Department BRIEF PSYCHOSOCIAL ASSESSMENT 11/15/2012  Patient:  Sherri [name]     Account Number:  [id number]     Admit date:  11/12/2012  Clinical Social Worker:  Riata Ikeda, LCSW  Date/Time:  11/15/2012 03:30 PM  Referred by:  RN  Date Referred:  11/14/2012  Other Referral:   NICU admission  CSW noted hx of marijuana use in H&P   Interview type:  Family Other interview type:   chart review  discussion with CNM    PSYCHOSOCIAL DATA Living Status:  FAMILY Admitted from facility:   Level of care:   Primary support name:  Sherri [name] Primary support relationship to patient:  SPOUSE Degree of support available:   Great support system    CURRENT CONCERNS Current Concerns  None Noted   Other Concerns:    SOCIAL WORK ASSESSMENT / PLAN CSW met with MOB at bedside to introduce myself, offer support for NICU admission and complete assessment.  This is the third time CSW has attempted to meet with MOB, so assessment was completed, but CSW did not discuss hx of marijuana or drug screen policy because she had visitors with her at baby's bedside.  CSW explained ongoing support services offered by NICU CSW and gave contact information. CSW does not have social concerns at this time and will attempt to discuss hospital drug screen policy with MOB before baby's discharge and will monitor drug screens. Baby's UDS is negative and per PNR, MOB stopped using marijuana when she found out she was pregnant.  CSW does not feel there are barriers to discharge when baby is medically ready.   Assessment/plan status:  Psychosocial Support/Ongoing Assessment of Needs Other assessment/ plan:   Information/referral to community resources:   No referral needs noted at this time.    PATIENT'S/FAMILY'S RESPONSE TO PLAN OF CARE: MOB was very pleasant and states she and baby are doing well.  She appears to have a good understanding of baby's situation and seems to be coping well  at this time.  She states no needs or concerns, but questioned where to get her car seat installed since she thinks that it is mandatory for it to be professionally installed at this time.  CSW states that having a car seat professionally installed is not required but advised her to inquire about this at a fire department if she wants.  She states they have a good support system and everything they need for baby at home.  MOB seemed very appreciative of CSW's visit.        

## 2012-11-16 LAB — TYPE AND SCREEN
ABO/RH(D): O POS
Antibody Screen: NEGATIVE
Unit division: 0

## 2012-11-16 MED ORDER — AMOXICILLIN-POT CLAVULANATE 875-125 MG PO TABS: 1.0000 | ORAL_TABLET | Freq: Two times a day (BID) | ORAL | Status: DC

## 2012-11-16 MED ORDER — OXYCODONE-ACETAMINOPHEN 5-325 MG PO TABS: 1.0000 | ORAL_TABLET | Freq: Four times a day (QID) | ORAL | Status: DC | PRN

## 2012-11-16 MED ORDER — IBUPROFEN 600 MG PO TABS: 600.0000 mg | ORAL_TABLET | Freq: Four times a day (QID) | ORAL | Status: DC

## 2012-11-16 NOTE — Discharge Summary (Signed)
Obstetric Discharge Summary Reason for Admission: rupture of membranes Prenatal Procedures: ultrasound, NST Intrapartum Procedures: cesarean: classical and GBS prophylaxis Postpartum Procedures: none Complications-Operative and Postpartum: superficial cellulitis of paniculus  Hemoglobin  Date Value Range Status  11/14/2012 10.1* 12.0 - 15.0 g/dL Final     HCT  Date Value Range Status  11/14/2012 29.6* 36.0 - 46.0 % Final   Pt presented to MAU on 11/12/12 at 0245 after SROM of clear fluid on 11/11/12 at approx 12 noon and subsequently admitted.  SVE closed on admission.  Pt GBS positive.  GBS prophylaxis with PCN started upon admission.  Pt desired TOLAC attempt.  Labor augmented with Pitocin throughout the day on 11/12/12.  Epidural placed at 1500 11/12/12.  IUPC placed on 11/12/12 at 1700 PM SVE 1cm as well as AROM of large forebag of clear fluid.  Pharmacy consult obtained at 2100 on 11/12/12 due to prolonged ROM and Gentamicin started.  SVE 2cm at 0030 11/13/12 and Pitocin at 61mu/min.  At that time pt requested to stop Pitocin and cease with attempted TOLAC.  FHR status reassuring throughout.  Pitocin d/ced.  Epidural and antibiotics contd throughout the night and patient was taken to the OR at [facility] 11/13/12.  Repeat LTCS started by Dr. Normand Sloop assisted by Dr. Gaynell Face and Dr. Pennie Rushing.  Adhesions and uterine rupture noted therefore classical incision performed and infant delivered.  EBL .  Pt remained afebrile, ambulating and tol po liquids and solids without difficulty at the time of discharge.  However superficial cellulitis was noted of the paniculus at the time of discharge and patient was started on po antibiotics at the time of discharge.   Physical Exam:  General: alert, cooperative and no distress Heart:  RRR Lungs:  CTA bilat Breasts:  Soft Abd:  Soft, NT with pos BS x 4 quads.  Warmth and edema noted of abdominal paniculus to superior portion of incision without redness  appreciated. Lochia: appropriate, sm rubra Uterine Fundus: firm, NT 1 below umb Incision: Occlusive dsg intact without drainage or redness noted.  Steri Strips intact beneath dsg.  No redness noted DVT Evaluation: No evidence of DVT seen on physical exam. Negative Homan's sign. No significant calf/ankle edema.  Discharge Diagnoses: Term Pregnancy-delivered, PROM x44 hours, TOLAC-aborted, uterine rupture, s/p classical C/S, superficial cellulitis of abd paniculus.  Discharge Information: Date: 11/16/2012 Activity: unrestricted Diet: routine Medications: PNV, Ibuprofen, Iron and Percocet Condition: stable Instructions: refer to practice specific booklet Discharge to: home Follow-up Information   Follow up with [facility]. Schedule an appointment as soon as possible for a visit in 5 days. (Need to be seen within the next week.)    Contact information:   [address] [place] [address]     Contraception:  Pt desires PP BTL and will discuss with MD at 1wk f/u visit.   Newborn Data: Live born female  Birth Weight: 6 lb 1.9 oz (2776 g) APGAR: 2, 6  Remains in NICU at time of maternal discharge.  Sherri [name] O. 11/16/2012, 12:13 PM

## 2012-11-16 NOTE — Progress Notes (Signed)
Discharge instructions provided to patient and significant other at bedside.  Medications, activity, follow up appointments, incision care, when to call the doctor and community resources discussed.  No questions at this time.  Patient left unit in stable condition with all personal belongings and prescriptions.  Osvaldo Angst, RN----

## 2012-11-16 NOTE — Progress Notes (Addendum)
Subjective: Postpartum Day 3: Cesarean Delivery  Patient reports feeling well.  Ambulating, voiding and tol po liquids and solids without difficulty.  Denies weakness or dizziness.  Pos flatus and pos BM this AM.  States incisional pain is well controlled with meds and generally mild but slightly increased today from previous days.  Reports infant doing well in NICU and is receiving treatment for jaundice but may potentially be discharged today.  Denies any postpartum depression s/s. Reports she is not pumping breastmilk at present as infant is being given formula in NICU.      Objective: Vital signs in last 24 hours: Temp:  [98 F (36.7 C)-98.1 F (36.7 C)] 98.1 F (36.7 C) (08/23 0609) Pulse Rate:  [82-91] 91 (08/23 0609) Resp:  [18] 18 (08/23 0609) BP: (115-116)/(79-82) 116/79 mmHg (08/23 0609) SpO2:  [98 %-99 %] 99 % (08/23 0609)  Physical Exam:  General: alert, cooperative and no distress Heart:  RRR Lungs:  CTA bilat Breasts:  Soft Abd:  Soft, NT with pos BS x 4 quads.  Warmth noted of panniculus down to superior edge of incision with sl edema noted.  No redness appreciated. Lochia: appropriate, sm rubra Uterine Fundus: firm, NT 1 below umb Incision: Occlusive dressing intact without drainage noted. No redness noted of incision underneath dressing.  Steristrips appear to be intact underneath dressing.   DVT Evaluation: No evidence of DVT seen on physical exam. Negative Homan's sign bilat. No significant calf/ankle edema.   Recent Labs  11/14/12 0515  HGB 10.1*  HCT 29.6*    Assessment/Plan: Attempted TOLAC after PROM (44hrs) Stable s/p repeat C/S (classical incision) S/P uterine rupture. Early superficial cellulitis of panniculus without evidence of endometritis   Will discharge to home. Discharge instructions reviewed. Will have pt begin Augmentin 875mg  po bid x 7 days at discharge due to suspected cellulitis. Rx Augmentin 875mg , Percocet, Motrin.  Resume PNV and  iron. Pt to follow up in office within the week due to cellulitis. Pt to monitor temperature bid and call for any increases.   Pt desires PP BTL prior to 6wks and states papers have been signed.  Will discuss with MD at f/u visit.   Alivea Gladson O. 11/16/2012, 12:11 PM

## 2012-12-11 ENCOUNTER — Other Ambulatory Visit: Payer: Self-pay | Admitting: Obstetrics and Gynecology

## 2012-12-20 ENCOUNTER — Encounter (HOSPITAL_COMMUNITY): Payer: Self-pay | Admitting: Pharmacist

## 2012-12-26 ENCOUNTER — Other Ambulatory Visit: Payer: Self-pay | Admitting: Obstetrics and Gynecology

## 2012-12-26 ENCOUNTER — Encounter (HOSPITAL_COMMUNITY): Payer: Self-pay

## 2012-12-26 ENCOUNTER — Encounter (HOSPITAL_COMMUNITY)
Admission: RE | Admit: 2012-12-26 | Discharge: 2012-12-26 | Disposition: A | Discharge status: Home / Self Care | Payer: Medicaid Other | Source: Ambulatory Visit | Attending: Obstetrics and Gynecology | Admitting: Obstetrics and Gynecology

## 2012-12-26 DIAGNOSIS — Z01818 Encounter for other preprocedural examination: Secondary | ICD-10-CM | POA: Insufficient documentation

## 2012-12-26 DIAGNOSIS — Z01812 Encounter for preprocedural laboratory examination: Secondary | ICD-10-CM | POA: Insufficient documentation

## 2012-12-26 LAB — CBC
MCHC: 33.9 g/dL (ref 30.0–36.0)
Platelets: 170 10*3/uL (ref 150–400)
RDW: 14.3 % (ref 11.5–15.5)

## 2012-12-26 NOTE — H&P (Signed)
  Sherri [name] is a 32 y.o.  female P 3-0-0-3 presents for tubal sterilization due to a desire to end child-bearing potential.  In the past the patient has used oral contraceptives, Nuva Ring and Depo Provera injections for contraception.  All of her deliveries have been C-sections with the exception of a single vaginal delivery of a stillborn infant.  Her menstrual flow lasts for 5 days and she changes her pad twice a day.  Though she has menstrual cramping, she rates them at 5/10 on a 10 point pain scale but finds relief with Tylenol. She goes on to give a history of uterine rupture and consequently wants to never be pregnant again, hence she has consented for sterilization.  Past Medical History  OB History: G3; P 3-0-0-2 C-section  x 2 and SVB of a stillborn x 1  GYN History: menarche: 32YO LMP:12/21/2012   Contracepton no method  The patient reports a past history of: chlamydia and gonorrhea.  Past history of abnormal PAP smear in 2012;  Last PAP smear-04/2012   was normal  Medical History:  Anemia, migraines and asthma  Surgical History: 2004 Cholecystectomy Denies problems with anesthesia.  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  ROS: Admits to glasses but denies headache, vision changes, nasal congestion, dysphagia, tinnitus, dizziness, hoarseness, cough,  chest pain, shortness of breath, nausea, vomiting, diarrhea,constipation,  urinary frequency, urgency  dysuria, hematuria, vaginitis symptoms, pelvic pain, swelling of joints,easy bruising,  myalgias, arthralgias, skin rashes, unexplained weight loss and except as is mentioned in the history of present  illness, patient's review of systems is otherwise negative.   Physical Exam  Bp:  104/68    P: 100   R: 12   Temperature:  98.2 degrees F orally  Weight: 220lbs   Height: 5' 1"    BMI: 41.6  Neck: supple without masses or thyromegaly Lungs: clear to auscultation Heart: regular rate and rhythm  (heart rate 88 regular) Abdomen: soft, non-tender and no organomegaly Pelvic:EGBUS- wnl; vagina-normal rugae; uterus-normal size, cervix without lesions or motion tenderness; adnexae-no tenderness or masses Extremities:  no clubbing, cyanosis or edema   Assesment:   Desire for sterilization   Disposition:  A discussion was held with patient regarding the indication for her procedure(s) along with the risks, which include but are not limited to: reaction to anesthesia, damage to adjacent organs, infection, excessive bleeding and failure rate of 2 per 500.  The patient verbalized understanding of these risks and has consented to proceed with Laparoscopic Bilateral Sapingectomy at [facility],  October 9,2014.   CSN# [id number]   Daymien Goth J. Samil Mecham, PA-C  for Dr. Naima A. Dillard   

## 2012-12-26 NOTE — Patient Instructions (Addendum)
Your procedure is scheduled on:01/02/13  Enter through the Main Entrance at :1130 am Pick up desk phone and dial [phone] and inform us of your arrival.  Please call [phone] if you have any problems the morning of surgery.  Remember: Do not eat food  after midnight:WED Clear liquids are ok until: 9am Thursday   You may brush your teeth the morning of surgery.   DO NOT wear jewelry, eye make-up, lipstick,body lotion, or dark fingernail polish.  (Polished toes are ok) You may wear deodorant.   Patients discharged on the day of surgery will not be allowed to drive home. Wear loose fitting, comfortable clothes for your ride home.

## 2013-01-02 ENCOUNTER — Encounter (HOSPITAL_COMMUNITY): Payer: Self-pay | Admitting: Anesthesiology

## 2013-01-02 ENCOUNTER — Encounter (HOSPITAL_COMMUNITY)
Admission: RE | Disposition: A | Discharge status: Home / Self Care | Payer: Self-pay | Source: Ambulatory Visit | Attending: Obstetrics and Gynecology

## 2013-01-02 ENCOUNTER — Encounter (HOSPITAL_COMMUNITY): Payer: Medicaid Other | Admitting: Certified Registered"

## 2013-01-02 ENCOUNTER — Ambulatory Visit (HOSPITAL_COMMUNITY): Payer: Medicaid Other

## 2013-01-02 ENCOUNTER — Ambulatory Visit (HOSPITAL_COMMUNITY)
Admission: RE | Admit: 2013-01-02 | Discharge: 2013-01-02 | Disposition: A | Discharge status: Home / Self Care | Payer: Medicaid Other | Source: Ambulatory Visit | Attending: Obstetrics and Gynecology | Admitting: Obstetrics and Gynecology

## 2013-01-02 ENCOUNTER — Ambulatory Visit (HOSPITAL_COMMUNITY): Payer: Medicaid Other | Admitting: Certified Registered"

## 2013-01-02 DIAGNOSIS — N736 Female pelvic peritoneal adhesions (postinfective): Secondary | ICD-10-CM | POA: Insufficient documentation

## 2013-01-02 DIAGNOSIS — Y9210 Unspecified residential institution as the place of occurrence of the external cause: Secondary | ICD-10-CM | POA: Insufficient documentation

## 2013-01-02 DIAGNOSIS — Z9889 Other specified postprocedural states: Secondary | ICD-10-CM

## 2013-01-02 DIAGNOSIS — S3760XA Unspecified injury of uterus, initial encounter: Secondary | ICD-10-CM | POA: Insufficient documentation

## 2013-01-02 DIAGNOSIS — IMO0002 Reserved for concepts with insufficient information to code with codable children: Secondary | ICD-10-CM | POA: Insufficient documentation

## 2013-01-02 DIAGNOSIS — Z5309 Procedure and treatment not carried out because of other contraindication: Secondary | ICD-10-CM | POA: Insufficient documentation

## 2013-01-02 HISTORY — PX: LAPAROSCOPIC BILATERAL SALPINGECTOMY: SHX5889

## 2013-01-02 HISTORY — PX: INTRAUTERINE DEVICE (IUD) INSERTION: SHX5877

## 2013-01-02 LAB — PREGNANCY, URINE: Preg Test, Ur: NEGATIVE

## 2013-01-02 SURGERY — SALPINGECTOMY, BILATERAL, LAPAROSCOPIC
Anesthesia: General | Site: Uterus | Wound class: Clean Contaminated

## 2013-01-02 MED ORDER — BUPIVACAINE HCL (PF) 0.25 % IJ SOLN
INTRAMUSCULAR | Status: DC | PRN
  Administered 2013-01-02: 7 mL

## 2013-01-02 MED ORDER — EPHEDRINE SULFATE 50 MG/ML IJ SOLN
INTRAMUSCULAR | Status: DC | PRN
  Administered 2013-01-02: 10 mg via INTRAVENOUS
  Administered 2013-01-02: 5 mg via INTRAVENOUS

## 2013-01-02 MED ORDER — MIDAZOLAM HCL 2 MG/2ML IJ SOLN
INTRAMUSCULAR | Status: DC | PRN
  Administered 2013-01-02: 2 mg via INTRAVENOUS

## 2013-01-02 MED ORDER — ONDANSETRON HCL 4 MG/2ML IJ SOLN
INTRAMUSCULAR | Status: DC | PRN
  Administered 2013-01-02: 4 mg via INTRAMUSCULAR

## 2013-01-02 MED ORDER — MIDAZOLAM HCL 2 MG/2ML IJ SOLN
INTRAMUSCULAR | Status: AC
  Filled 2013-01-02: qty 2

## 2013-01-02 MED ORDER — LACTATED RINGERS IV SOLN
INTRAVENOUS | Status: DC
  Administered 2013-01-02 (×2): via INTRAVENOUS

## 2013-01-02 MED ORDER — GLYCOPYRROLATE 0.2 MG/ML IJ SOLN
INTRAMUSCULAR | Status: DC | PRN
  Administered 2013-01-02: 0.6 mg via INTRAVENOUS

## 2013-01-02 MED ORDER — GLYCOPYRROLATE 0.2 MG/ML IJ SOLN
INTRAMUSCULAR | Status: AC
  Filled 2013-01-02: qty 2

## 2013-01-02 MED ORDER — HYDROMORPHONE HCL PF 1 MG/ML IJ SOLN
INTRAMUSCULAR | Status: DC | PRN
  Administered 2013-01-02: 1 mg via INTRAVENOUS

## 2013-01-02 MED ORDER — PROPOFOL 10 MG/ML IV BOLUS
INTRAVENOUS | Status: DC | PRN
  Administered 2013-01-02: 200 mg via INTRAVENOUS

## 2013-01-02 MED ORDER — FENTANYL CITRATE 0.05 MG/ML IJ SOLN
INTRAMUSCULAR | Status: DC | PRN
  Administered 2013-01-02 (×5): 50 ug via INTRAVENOUS
  Administered 2013-01-02: 100 ug via INTRAVENOUS

## 2013-01-02 MED ORDER — MEDROXYPROGESTERONE ACETATE 150 MG/ML IM SUSP
INTRAMUSCULAR | Status: AC
  Administered 2013-01-02: 150 mg via INTRAMUSCULAR
  Filled 2013-01-02: qty 1

## 2013-01-02 MED ORDER — NEOSTIGMINE METHYLSULFATE 1 MG/ML IJ SOLN
INTRAMUSCULAR | Status: DC | PRN
  Administered 2013-01-02: 3 mg via INTRAVENOUS

## 2013-01-02 MED ORDER — NEOSTIGMINE METHYLSULFATE 1 MG/ML IJ SOLN
INTRAMUSCULAR | Status: AC
  Filled 2013-01-02: qty 1

## 2013-01-02 MED ORDER — LIDOCAINE HCL (CARDIAC) 20 MG/ML IV SOLN
INTRAVENOUS | Status: AC
  Filled 2013-01-02: qty 5

## 2013-01-02 MED ORDER — PROPOFOL 10 MG/ML IV EMUL
INTRAVENOUS | Status: AC
  Filled 2013-01-02: qty 20

## 2013-01-02 MED ORDER — MEPERIDINE HCL 25 MG/ML IJ SOLN: 6.2500 mg | INTRAMUSCULAR | Status: DC | PRN

## 2013-01-02 MED ORDER — ROCURONIUM BROMIDE 100 MG/10ML IV SOLN
INTRAVENOUS | Status: DC | PRN
  Administered 2013-01-02 (×3): 10 mg via INTRAVENOUS
  Administered 2013-01-02: 40 mg via INTRAVENOUS

## 2013-01-02 MED ORDER — FENTANYL CITRATE 0.05 MG/ML IJ SOLN
INTRAMUSCULAR | Status: AC
  Administered 2013-01-02: 25 ug via INTRAVENOUS
  Filled 2013-01-02: qty 2

## 2013-01-02 MED ORDER — ROCURONIUM BROMIDE 50 MG/5ML IV SOLN
INTRAVENOUS | Status: AC
  Filled 2013-01-02: qty 1

## 2013-01-02 MED ORDER — ONDANSETRON HCL 4 MG/2ML IJ SOLN
INTRAMUSCULAR | Status: AC
  Filled 2013-01-02: qty 2

## 2013-01-02 MED ORDER — SILVER NITRATE-POT NITRATE 75-25 % EX MISC
CUTANEOUS | Status: DC | PRN
  Administered 2013-01-02: 5

## 2013-01-02 MED ORDER — FENTANYL CITRATE 0.05 MG/ML IJ SOLN
INTRAMUSCULAR | Status: AC
  Filled 2013-01-02: qty 2

## 2013-01-02 MED ORDER — IBUPROFEN 600 MG PO TABS: 600.0000 mg | ORAL_TABLET | Freq: Four times a day (QID) | ORAL | Status: AC | PRN

## 2013-01-02 MED ORDER — DOXYCYCLINE HYCLATE 50 MG PO CAPS: 100.0000 mg | ORAL_CAPSULE | Freq: Two times a day (BID) | ORAL | Status: AC

## 2013-01-02 MED ORDER — DEXAMETHASONE SODIUM PHOSPHATE 10 MG/ML IJ SOLN
INTRAMUSCULAR | Status: AC
  Filled 2013-01-02: qty 1

## 2013-01-02 MED ORDER — FENTANYL CITRATE 0.05 MG/ML IJ SOLN
25.0000 ug | INTRAMUSCULAR | Status: DC | PRN
  Administered 2013-01-02: 25 ug via INTRAVENOUS

## 2013-01-02 MED ORDER — KETOROLAC TROMETHAMINE 30 MG/ML IJ SOLN
INTRAMUSCULAR | Status: DC | PRN
  Administered 2013-01-02: 30 mg via INTRAVENOUS

## 2013-01-02 MED ORDER — DEXAMETHASONE SODIUM PHOSPHATE 10 MG/ML IJ SOLN
INTRAMUSCULAR | Status: DC | PRN
  Administered 2013-01-02: 10 mg via INTRAVENOUS

## 2013-01-02 MED ORDER — HYDROMORPHONE HCL PF 1 MG/ML IJ SOLN
INTRAMUSCULAR | Status: AC
  Filled 2013-01-02: qty 1

## 2013-01-02 MED ORDER — METOCLOPRAMIDE HCL 5 MG/ML IJ SOLN
INTRAMUSCULAR | Status: AC
  Filled 2013-01-02: qty 2

## 2013-01-02 MED ORDER — BUPIVACAINE HCL (PF) 0.25 % IJ SOLN
INTRAMUSCULAR | Status: AC
  Filled 2013-01-02: qty 30

## 2013-01-02 MED ORDER — SILVER NITRATE-POT NITRATE 75-25 % EX MISC
CUTANEOUS | Status: AC
  Filled 2013-01-02: qty 5

## 2013-01-02 MED ORDER — LIDOCAINE HCL (CARDIAC) 20 MG/ML IV SOLN
INTRAVENOUS | Status: DC | PRN
  Administered 2013-01-02: 80 mg via INTRAVENOUS

## 2013-01-02 MED ORDER — MEDROXYPROGESTERONE ACETATE 150 MG/ML IM SUSP
150.0000 mg | Freq: Once | INTRAMUSCULAR | Status: AC
  Administered 2013-01-02: 150 mg via INTRAMUSCULAR

## 2013-01-02 MED ORDER — HYDROCODONE-ACETAMINOPHEN 5-325 MG PO TABS: 1.0000 | ORAL_TABLET | Freq: Four times a day (QID) | ORAL | Status: DC | PRN

## 2013-01-02 MED ORDER — FENTANYL CITRATE 0.05 MG/ML IJ SOLN
INTRAMUSCULAR | Status: AC
  Filled 2013-01-02: qty 5

## 2013-01-02 MED ORDER — METOCLOPRAMIDE HCL 5 MG/ML IJ SOLN
10.0000 mg | Freq: Once | INTRAMUSCULAR | Status: AC | PRN
  Administered 2013-01-02: 10 mg via INTRAVENOUS

## 2013-01-02 MED ORDER — KETOROLAC TROMETHAMINE 30 MG/ML IJ SOLN
INTRAMUSCULAR | Status: AC
  Filled 2013-01-02: qty 1

## 2013-01-02 SURGICAL SUPPLY — 26 items
ADH SKN CLS APL DERMABOND .7 (GAUZE/BANDAGES/DRESSINGS) ×2
CABLE HIGH FREQUENCY MONO STRZ (ELECTRODE) ×1 IMPLANT
CHLORAPREP W/TINT 26ML (MISCELLANEOUS) ×3 IMPLANT
CLOTH BEACON ORANGE TIMEOUT ST (SAFETY) ×3 IMPLANT
DECANTER SPIKE VIAL GLASS SM (MISCELLANEOUS) ×1 IMPLANT
DERMABOND ADVANCED (GAUZE/BANDAGES/DRESSINGS) ×1
DERMABOND ADVANCED .7 DNX12 (GAUZE/BANDAGES/DRESSINGS) ×2 IMPLANT
EVACUATOR PREFILTER SMOKE (MISCELLANEOUS) ×1 IMPLANT
GLOVE BIO SURGEON STRL SZ 6.5 (GLOVE) ×4 IMPLANT
GLOVE BIOGEL PI IND STRL 7.0 (GLOVE) ×4 IMPLANT
GLOVE BIOGEL PI INDICATOR 7.0 (GLOVE) ×4
GLOVE SURG SS PI 7.0 STRL IVOR (GLOVE) ×5 IMPLANT
GOWN STRL REIN XL XLG (GOWN DISPOSABLE) ×8 IMPLANT
HEMOSTAT SURGICEL 2X14 (HEMOSTASIS) ×1 IMPLANT
NS IRRIG 1000ML POUR BTL (IV SOLUTION) ×3 IMPLANT
PACK LAPAROSCOPY BASIN (CUSTOM PROCEDURE TRAY) ×3 IMPLANT
PROTECTOR NERVE ULNAR (MISCELLANEOUS) ×4 IMPLANT
SET IRRIG TUBING LAPAROSCOPIC (IRRIGATION / IRRIGATOR) ×2 IMPLANT
SUT MNCRL AB 3-0 PS2 27 (SUTURE) ×3 IMPLANT
SUT VICRYL 0 UR6 27IN ABS (SUTURE) ×6 IMPLANT
TOWEL OR 17X24 6PK STRL BLUE (TOWEL DISPOSABLE) ×7 IMPLANT
TRAY FOLEY CATH 14FR (SET/KITS/TRAYS/PACK) ×3 IMPLANT
TROCAR BALLN 12MMX100 BLUNT (TROCAR) ×3 IMPLANT
TROCAR XCEL NON-BLD 5MMX100MML (ENDOMECHANICALS) ×6 IMPLANT
WARMER LAPAROSCOPE (MISCELLANEOUS) ×3 IMPLANT
WATER STERILE IRR 1000ML POUR (IV SOLUTION) ×3 IMPLANT

## 2013-01-02 NOTE — H&P (View-Only) (Signed)
  Sherri [name] is a 32 y.o.  female P 3-0-0-3 presents for tubal sterilization due to a desire to end child-bearing potential.  In the past the patient has used oral contraceptives, Nuva Ring and Depo Provera injections for contraception.  All of her deliveries have been C-sections with the exception of a single vaginal delivery of a stillborn infant.  Her menstrual flow lasts for 5 days and she changes her pad twice a day.  Though she has menstrual cramping, she rates them at 5/10 on a 10 point pain scale but finds relief with Tylenol. She goes on to give a history of uterine rupture and consequently wants to never be pregnant again, hence she has consented for sterilization.  Past Medical History  OB History: G3; P 3-0-0-2 C-section  x 2 and SVB of a stillborn x 1  GYN History: menarche: 32YO LMP:12/21/2012   Contracepton no method  The patient reports a past history of: chlamydia and gonorrhea.  Past history of abnormal PAP smear in 2012;  Last PAP smear-04/2012   was normal  Medical History:  Anemia, migraines and asthma  Surgical History: 2004 Cholecystectomy Denies problems with anesthesia.  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  ROS: Admits to glasses but denies headache, vision changes, nasal congestion, dysphagia, tinnitus, dizziness, hoarseness, cough,  chest pain, shortness of breath, nausea, vomiting, diarrhea,constipation,  urinary frequency, urgency  dysuria, hematuria, vaginitis symptoms, pelvic pain, swelling of joints,easy bruising,  myalgias, arthralgias, skin rashes, unexplained weight loss and except as is mentioned in the history of present  illness, patient's review of systems is otherwise negative.   Physical Exam  Bp:  104/68    P: 100   R: 12   Temperature:  98.2 degrees F orally  Weight: 220lbs   Height: 5\' 1"     BMI: 41.6  Neck: supple without masses or thyromegaly Lungs: clear to auscultation Heart: regular rate and rhythm  (heart rate 88 regular) Abdomen: soft, non-tender and no organomegaly Pelvic:EGBUS- wnl; vagina-normal rugae; uterus-normal size, cervix without lesions or motion tenderness; adnexae-no tenderness or masses Extremities:  no clubbing, cyanosis or edema   Assesment:   Desire for sterilization   Disposition:  A discussion was held with patient regarding the indication for her procedure(s) along with the risks, which include but are not limited to: reaction to anesthesia, damage to adjacent organs, infection, excessive bleeding and failure rate of 2 per 500.  The patient verbalized understanding of these risks and has consented to proceed with Laparoscopic Bilateral Sapingectomy at [facility] [place],  October 9,2014.   CSN# [id number]   [name] J. Lowell Guitar, PA-C  for Dr. Pierre Bali. Dillard

## 2013-01-02 NOTE — Transfer of Care (Signed)
Immediate Anesthesia Transfer of Care Note  Patient: Sherri [name]  Procedure(s) Performed: Procedure(s): Operative LAPAROSCOPIC, lysis of adhesions  attempted BILATERAL SALPINGECTOMY (N/A) attempted INTRAUTERINE DEVICE (IUD) INSERTION (N/A)  Patient Location: PACU  Anesthesia Type:General  Level of Consciousness: awake, alert  and oriented  Airway & Oxygen Therapy: Patient Spontanous Breathing and Patient connected to nasal cannula oxygen  Post-op Assessment: Report given to PACU RN and Post -op Vital signs reviewed and stable  Post vital signs: Reviewed and stable  Complications: No apparent anesthesia complications

## 2013-01-02 NOTE — Anesthesia Preprocedure Evaluation (Addendum)
Anesthesia Evaluation  Patient identified by MRN, date of birth, ID band Patient awake    Reviewed: Allergy & Precautions, H&P , NPO status , Patient's Chart, lab work & pertinent test results  Airway Mallampati: III TM Distance: >3 FB Neck ROM: Full    Dental no notable dental hx. (+) Teeth Intact   Pulmonary asthma ,  breath sounds clear to auscultation  Pulmonary exam normal       Cardiovascular negative cardio ROS  Rhythm:Regular Rate:Normal     Neuro/Psych  Headaches, negative psych ROS   GI/Hepatic Neg liver ROS,   Endo/Other  Morbid obesity  Renal/GU negative Renal ROS  negative genitourinary   Musculoskeletal negative musculoskeletal ROS (+)   Abdominal (+) + obese,   Peds  Hematology  (+) Blood dyscrasia, anemia , Hx/o Blood transfusion   Anesthesia Other Findings   Reproductive/Obstetrics Desires permanent contraception Hx/o Uterine rupture Hx/o Uterine atony                          Anesthesia Physical Anesthesia Plan  ASA: III  Anesthesia Plan: General   Post-op Pain Management:    Induction: Intravenous  Airway Management Planned: Oral ETT  Additional Equipment:   Intra-op Plan:   Post-operative Plan: Extubation in OR  Informed Consent: I have reviewed the patients History and Physical, chart, labs and discussed the procedure including the risks, benefits and alternatives for the proposed anesthesia with the patient or authorized representative who has indicated his/her understanding and acceptance.   Dental advisory given  Plan Discussed with: Anesthesiologist, CRNA and Surgeon  Anesthesia Plan Comments:         Anesthesia Quick Evaluation

## 2013-01-02 NOTE — Op Note (Signed)
Preoperative diagnosis desires sterilization Postoperative diagnosis: Same Procedure: Operative laparoscopy lysis of adhesions attempted placement of IUD Surgeon: Dr. Jaymes Graff Assistant : Henreitta Leber PA Anesthesia: General Complications: None Estimated blood loss: 50 cc Urine output: 350 cc clear urine IV fluids: 1700 cc crystalloid Procedure in detail: The patient was taken to the operating room placed in dorsal lithotomy position and prepped and draped in the normal sterile fashion.  A catheter was placed into the bladder.  A bivalve speculum was placed into the vagina. The anterior lip the cervix was grasped with single-tooth tenaculum.  Uterus sounded to 8 cm.  It felt adherent to the anterior abdominal wall on exam under anesthesia.   The acorn was placed into the cervix and attached to the tenaculum. It was then turned to the umbilicus.  For umbilical incision was made with the scalpel is about 10 mm long.  This carried down to the fascia.  And 1 point the fascia was incised at the level of the belly button and the skin above the bellybutton was accidentally lysed with the scalpel.  It was 3 mm in size.  The fascia was then grasped entered sharply with the scalpel.  Fascia was then circumscribed with 0 Vicryl.  A sound was placed into the abdominal cavity.  The findings were as such: The right tube and ovary was identified but it was adherent to the uterus. The fundus and left side of the uterus was adherent to the anterior abdominal wall along with the omentum.  : Was adherent to the left side of the uterus in the anterior abdominal wall. The cul-de-sac was free of adhesions and easily identified.  Bladder was adherent to the anterior abdominal wall in the uterus. A 5 mm incision was made in the right and left lower quadrant and after injection with bupivacaine a 5 mm across placed in the right lower quadrant.  I cannot place a trocar in the left lower quadrants because of the multiple  adhesions.  Using the operative scope I was able to lyse the adhesions from the bowel to the uterus and some of the adhesions from the uterus to the anterior abdominal wall.  Still unable to see the left tube and ovary because of the adhesions to the left side wall and abdominal wall there was no gradient visualization in its I did not feel comfortable pursuing lysis of adhesions in a blind fashion.   This point I went down to the vagina and there is was we sounded began in because was adherent to the abdominal wall still sound to 8 cm.  The cervix was dilated with Pratt dilators up to 21.  The Mirena IUD was placed into the uterine cavity while visualizing this above of the laparoscope.  We could then see the Mirena perforate through the uterus on the left side of the uterine wall.  The perforation was hemostatic.  I then removed the IUD and throat away.  Irrigation was done along the uterus and cul-de-sac and there was some bleeding with the adhesions were lysed at the top of the fundus of the uterus.  This was made hemostatic using Kleppinger cautery and Surgicel.  Patient's liver and appendix appeared normal. A 5 mm scope was placed in the right lower quadrant trocar to look up at the anterior abdominal wall incision with the Roseanne Reno was placed.  Were no adhesions or bowel noted near the [place].  Allis was removed from the abdomen the fascia was closed by tying  the pursestring string suture.  Small skin incisions, including the one accidentally made above umbilicus was closed with Dermabond.  The 10 mm infraumbilical incision was closed with 3-0 Monocryl subcuticular fashion and Dermabond.  On  the left side of the patient it was noted a light cord had burned a hole in the drape.   we lifted the drape at the  exact place on the patient and there was no burns to her body or person.   Sponge lap and needle counts were correct patient her room in stable condition this is Dr. Normand Sloop dictating thank you very much

## 2013-01-02 NOTE — Interval H&P Note (Signed)
History and Physical Interval Note:  01/02/2013 1:06 PM  Sherri [name]  has presented today for surgery, with the diagnosis of Desires Sterilization;  The various methods of treatment have been discussed with the patient and family. After consideration of risks, benefits and other options for treatment, the patient has consented to  Procedure(s): LAPAROSCOPIC BILATERAL SALPINGECTOMY (Bilateral) INTRAUTERINE DEVICE (IUD) INSERTION (N/A) as a surgical intervention .  The patient's history has been reviewed, patient examined, no change in status, stable for surgery.  I have reviewed the patient's chart and labs.  Questions were answered to the patient's satisfaction.  The pt had scar tissue that made it prohibitive to visualize the fallopian tubes during her cesarean.  If I am not able to visualize the tubes, I will place a mirena IUD.  Pt agrees with the plan   [facility]

## 2013-01-02 NOTE — Anesthesia Postprocedure Evaluation (Signed)
  Anesthesia Post-op Note  Patient: Sherri [name]  Procedure(s) Performed: Procedure(s): Operative LAPAROSCOPIC, lysis of adhesions  attempted BILATERAL SALPINGECTOMY (N/A) attempted INTRAUTERINE DEVICE (IUD) INSERTION (N/A) Patient is awake and responsive. Pain and nausea are reasonably well controlled. Vital signs are stable and clinically acceptable. Oxygen saturation is clinically acceptable. There are no apparent anesthetic complications at this time. Patient is ready for discharge.

## 2013-01-02 NOTE — Preoperative (Signed)
Beta Blockers   Reason not to administer Beta Blockers:Not Applicable 

## 2013-01-03 ENCOUNTER — Encounter (HOSPITAL_COMMUNITY): Payer: Self-pay | Admitting: Obstetrics and Gynecology

## 2014-01-26 ENCOUNTER — Encounter (HOSPITAL_COMMUNITY): Payer: Self-pay | Admitting: Obstetrics and Gynecology

## 2019-05-11 ENCOUNTER — Other Ambulatory Visit: Payer: Self-pay

## 2019-05-11 ENCOUNTER — Emergency Department (HOSPITAL_COMMUNITY)
Admission: EM | Admit: 2019-05-11 | Discharge: 2019-05-12 | Disposition: A | Discharge status: Home / Self Care | Payer: Self-pay | Attending: Emergency Medicine | Admitting: Emergency Medicine

## 2019-05-11 ENCOUNTER — Emergency Department (HOSPITAL_COMMUNITY): Payer: Self-pay

## 2019-05-11 ENCOUNTER — Encounter (HOSPITAL_COMMUNITY): Payer: Self-pay

## 2019-05-11 DIAGNOSIS — M79671 Pain in right foot: Secondary | ICD-10-CM | POA: Insufficient documentation

## 2019-05-11 DIAGNOSIS — Z87891 Personal history of nicotine dependence: Secondary | ICD-10-CM | POA: Insufficient documentation

## 2019-05-11 DIAGNOSIS — M898X7 Other specified disorders of bone, ankle and foot: Secondary | ICD-10-CM

## 2019-05-11 NOTE — ED Provider Notes (Signed)
[facility] DEPT Provider Note   CSN: [id number] Arrival date & time: 05/11/19  2302   History Chief Complaint  Patient presents with  . Foot Swelling    R    Sherri [name] is a 39 y.o. female.  The history is provided by the patient.  She has no significant past history and comes in complaining of pain and swelling of the right foot for the last week.  Pain is over the distal midfoot and over the medial and lateral aspect of the foot.  She rates pain at 8/10.  It is worse when she stands on it and worse when she touches it.  She has been taking ibuprofen which gives slight, temporary relief.  She denies any trauma.  She denies any prior similar episodes.  Past Medical History:  Diagnosis Date  . Abnormal Pap smear AGE 23    COLPO; LAST PAP 2012  . Anemia 2004   PP  . Blood transfusion without reported diagnosis    PP 2006  . Infection 2002   CHLAMYDIA  . Infection 2002   GC  . Infection 2004   PP ENDOMETRITIS    Patient Active Problem List   Diagnosis Date Noted  . Cesarean delivery delivered 11/15/2012  . Uterine rupture 11/15/2012  . Anemia 11/15/2012  . Migraine 11/12/2012  . 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 Prior pregnancy with fetal demise in third trimester 11/12/2012  . Obesity 05/09/2012  . H/O cesarean section 05/09/2012  . Asthma 05/09/2012  . Rash and nonspecific skin eruption 12/01/2010  . ALLERGIC RHINITIS 07/03/2008    Past Surgical History:  Procedure Laterality Date  . CESAREAN SECTION  2004  . CESAREAN SECTION N/A 11/13/2012   Procedure: CESAREAN SECTION;  Surgeon: Betsy Coder, MD;  Location: [facility];  Service: Obstetrics;  Laterality: N/A;  . CHOLECYSTECTOMY  2005  . INTRAUTERINE DEVICE (IUD) INSERTION N/A 01/02/2013   Procedure: attempted INTRAUTERINE DEVICE (IUD) INSERTION;  Surgeon: Betsy Coder, MD;  Location: [facility];  Service: Gynecology;  Laterality: N/A;  . LAPAROSCOPIC BILATERAL SALPINGECTOMY  N/A 01/02/2013   Procedure: Operative LAPAROSCOPIC, lysis of adhesions  attempted BILATERAL SALPINGECTOMY;  Surgeon: Betsy Coder, MD;  Location: [facility];  Service: Gynecology;  Laterality: N/A;  . WISDOM TOOTH EXTRACTION  AS TEEN     OB History    Gravida  3   Para  3   Term  3   Preterm      AB      Living  2     SAB      TAB      Ectopic      Multiple      Live Births  3        Obstetric Comments  2004 FAILED INDUCTION;  PROLONGED ROM; C/S; PP BLOOD TRANSFUSION DUE TO BLOOD LOSS DURING C/S; PP ENDOMETRITIS; HOSPITALIZED FOR 4 WEEKS AT [facility]        Family History  Problem Relation Age of Onset  . Diabetes Mother   . Heart disease Mother   . Arthritis Father   . COPD Father   . Cancer Father        PROSTATE  . Hypertension Father     Social History   Tobacco Use  . Smoking status: Former Smoker    Quit date: 05/27/2010    Years since quitting: 8.9  . Smokeless tobacco: Never Used  Substance Use Topics  . Alcohol use: Yes  Comment: WEEKENDS; LAST DRANK 03/27/2012  . Drug use: Yes    Frequency: 2.0 times per week    Comment: MJ  03/27/2012    Home Medications Prior to Admission medications   Medication Sig Start Date End Date Taking? Authorizing Provider  acetaminophen (TYLENOL) 325 MG tablet Take 650 mg by mouth every 6 (six) hours as needed for pain.    [provider]  HYDROcodone-acetaminophen (NORCO/VICODIN) 5-325 MG per tablet Take 1 tablet by mouth every 6 (six) hours as needed for pain. 01/02/13   Jaymes Graff, MD  ibuprofen (ADVIL,MOTRIN) 600 MG tablet Take 1 tablet (600 mg total) by mouth every 6 (six) hours as needed for pain. 01/02/13   Jaymes Graff, MD  IRON PO Take 1 tablet by mouth daily.    [provider]  Prenatal Vit-Fe Fumarate-FA (PRENATAL VITAMINS PLUS) 27-1 MG TABS Take 1 tablet by mouth daily. 1 tab po once daily 04/01/12   Hayden Rasmussen, NP    Allergies    Patient has no known allergies.  Review of Systems    Review of Systems  All other systems reviewed and are negative.   Physical Exam Updated Vital Signs BP 131/81 (BP Location: Right Arm)   Pulse 92   Temp 98.4 F (36.9 C) (Oral)   Resp 16   Ht 5' (1.524 m)   Wt 99.8 kg   SpO2 98%   BMI 42.97 kg/m   Physical Exam Vitals and nursing note reviewed.   39 year old female, resting comfortably and in no acute distress. Vital signs are normal. Oxygen saturation is 98%, which is normal. Head is normocephalic and atraumatic. PERRLA, EOMI. Oropharynx is clear. Neck is nontender and supple without adenopathy or JVD. Back is nontender and there is no CVA tenderness. Lungs are clear without rales, wheezes, or rhonchi. Chest is nontender. Heart has regular rate and rhythm without murmur. Abdomen is soft, flat, nontender without masses or hepatosplenomegaly and peristalsis is normoactive. Extremities: Mild swelling and warmth of the dorsum of the right foot over the second through fifth distal metatarsal region.  This area is tender.  No lymphangitic streaks seen.  Remainder of extremity exam is normal Skin is warm and dry without rash. Neurologic: Mental status is normal, cranial nerves are intact, there are no motor or sensory deficits.  ED Results / Procedures / Treatments    Radiology DG Foot Complete Right  Result Date: 05/11/2019 CLINICAL DATA:  Right dorsal foot pain and swelling EXAM: RIGHT FOOT COMPLETE - 3+ VIEW COMPARISON:  None. FINDINGS: Frontal, oblique, and lateral views of the right foot demonstrate no fractures. Alignment is anatomic. Joint spaces are well preserved. Soft tissues are normal. IMPRESSION: 1. Unremarkable right foot. Electronically Signed   By: Sharlet Salina M.D.   On: 05/11/2019 23:57    Procedures Procedures   Medications Ordered in ED Medications  predniSONE (DELTASONE) tablet 60 mg (has no administration in time range)    ED Course  I have reviewed the triage vital signs and the nursing notes.   Pertinent imaging results that were available during my care of the patient were reviewed by me and considered in my medical decision making (see chart for details).  MDM Rules/Calculators/A&P Pain and swelling of the right foot.  X-rays have been obtained and show no evidence of fracture.  Suspect gout.  No indication for lab testing today.  She will be discharged with prescription for 5 days of prednisone and a small number of hydrocodone-acetaminophen tablets  for pain.  Advised to continue taking ibuprofen as needed.  Final Clinical Impression(s) / ED Diagnoses Final diagnoses:  Pain in metatarsus of right foot    Rx / DC Orders ED Discharge Orders         Ordered    HYDROcodone-acetaminophen (NORCO/VICODIN) 5-325 MG tablet  Every 6 hours PRN     05/12/19 0010    predniSONE (DELTASONE) 20 MG tablet  Daily     05/12/19 0010           Dione Booze, MD 05/12/19 0013

## 2019-05-11 NOTE — ED Triage Notes (Signed)
Pt reports R sided dorsal foot pain and swelling x 1 week. Denies injury. States that it hurts to touch.

## 2019-05-12 MED ORDER — PREDNISONE 20 MG PO TABS: 60.0000 mg | ORAL_TABLET | Freq: Every day | ORAL | 0 refills | Status: DC

## 2019-05-12 MED ORDER — PREDNISONE 20 MG PO TABS
60.0000 mg | ORAL_TABLET | Freq: Once | ORAL | Status: AC
  Administered 2019-05-12: 60 mg via ORAL
  Filled 2019-05-12: qty 3

## 2019-05-12 MED ORDER — PREDNISONE 20 MG PO TABS
60.0000 mg | ORAL_TABLET | Freq: Every day | ORAL | 0 refills | Status: AC
Start: 2019-05-12 — End: ?

## 2019-05-12 MED ORDER — HYDROCODONE-ACETAMINOPHEN 5-325 MG PO TABS: 1.0000 | ORAL_TABLET | Freq: Four times a day (QID) | ORAL | 0 refills | Status: DC | PRN

## 2019-05-12 MED ORDER — HYDROCODONE-ACETAMINOPHEN 5-325 MG PO TABS: 1.0000 | ORAL_TABLET | Freq: Four times a day (QID) | ORAL | 0 refills | Status: AC | PRN

## 2019-05-12 NOTE — Discharge Instructions (Addendum)
Apply ice as needed.  Continue to take ibuprofen as needed.  Return if symptoms are getting worse.

## 2019-06-22 ENCOUNTER — Encounter (HOSPITAL_COMMUNITY): Payer: Self-pay | Admitting: Emergency Medicine

## 2019-06-22 ENCOUNTER — Emergency Department (HOSPITAL_COMMUNITY)
Admission: EM | Admit: 2019-06-22 | Discharge: 2019-06-22 | Disposition: A | Discharge status: Home / Self Care | Payer: Self-pay | Attending: Emergency Medicine | Admitting: Emergency Medicine

## 2019-06-22 ENCOUNTER — Other Ambulatory Visit: Payer: Self-pay

## 2019-06-22 DIAGNOSIS — Z79899 Other long term (current) drug therapy: Secondary | ICD-10-CM | POA: Insufficient documentation

## 2019-06-22 DIAGNOSIS — L03115 Cellulitis of right lower limb: Secondary | ICD-10-CM | POA: Insufficient documentation

## 2019-06-22 DIAGNOSIS — Z87891 Personal history of nicotine dependence: Secondary | ICD-10-CM | POA: Insufficient documentation

## 2019-06-22 DIAGNOSIS — L03119 Cellulitis of unspecified part of limb: Secondary | ICD-10-CM

## 2019-06-22 MED ORDER — CEPHALEXIN 500 MG PO CAPS: 500.0000 mg | ORAL_CAPSULE | Freq: Four times a day (QID) | ORAL | 0 refills | Status: AC

## 2019-06-22 MED ORDER — CEPHALEXIN 500 MG PO CAPS: 500.0000 mg | ORAL_CAPSULE | Freq: Four times a day (QID) | ORAL | 0 refills | Status: DC

## 2019-06-22 MED ORDER — CEPHALEXIN 500 MG PO CAPS
500.0000 mg | ORAL_CAPSULE | Freq: Once | ORAL | Status: AC
  Administered 2019-06-22: 16:00:00 500 mg via ORAL
  Filled 2019-06-22: qty 1

## 2019-06-22 MED ORDER — BUPIVACAINE HCL (PF) 0.5 % IJ SOLN
10.0000 mL | Freq: Once | INTRAMUSCULAR | Status: AC
  Administered 2019-06-22: 10 mL
  Filled 2019-06-22: qty 30

## 2019-06-22 NOTE — ED Provider Notes (Signed)
[facility]-EMERGENCY DEPT Provider Note   CSN: [id number] Arrival date & time: 06/22/19  1349     History Chief Complaint  Patient presents with  . Foot Pain    Sherri [name] is a 39 y.o. female   HPI Patient is 39 year old female with approximately 3 weeks of right foot pain.  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  She states she was seen sometime ago and diagnosed with gout however she feels that the medication somewhat improved her symptoms however yesterday a small wound opened on her right foot and she is concerned as it is more painful now.  Patient denies any drainage, purulent drainage, fevers, nausea, vomiting, diaphoresis, weakness, fatigue, malaise, body aches.  Denies any chest pain, shortness of breath, fevers or headaches.      Past Medical History:  Diagnosis Date  . Abnormal Pap smear AGE 43    COLPO; LAST PAP 2012  . Anemia 2004   PP  . Blood transfusion without reported diagnosis    PP 2006  . Infection 2002   CHLAMYDIA  . Infection 2002   GC  . Infection 2004   PP ENDOMETRITIS    Patient Active Problem List   Diagnosis Date Noted  . Cesarean delivery delivered 11/15/2012  . Uterine rupture 11/15/2012  . Anemia 11/15/2012  . Migraine 11/12/2012  . 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 Prior pregnancy with fetal demise in third trimester 11/12/2012  . Obesity 05/09/2012  . H/O cesarean section 05/09/2012  . Asthma 05/09/2012  . Rash and nonspecific skin eruption 12/01/2010  . ALLERGIC RHINITIS 07/03/2008    Past Surgical History:  Procedure Laterality Date  . CESAREAN SECTION  2004  . CESAREAN SECTION N/A 11/13/2012   Procedure: CESAREAN SECTION;  Surgeon: Michael Litter, MD;  Location: [facility];  Service: Obstetrics;  Laterality: N/A;  . CHOLECYSTECTOMY  2005  . INTRAUTERINE DEVICE (IUD) INSERTION N/A 01/02/2013   Procedure: attempted INTRAUTERINE DEVICE (IUD) INSERTION;  Surgeon: Michael Litter, MD;  Location: [facility];  Service: Gynecology;  Laterality: N/A;  . LAPAROSCOPIC BILATERAL SALPINGECTOMY N/A 01/02/2013   Procedure: Operative LAPAROSCOPIC, lysis of adhesions  attempted BILATERAL SALPINGECTOMY;  Surgeon: Michael Litter, MD;  Location: [facility];  Service: Gynecology;  Laterality: N/A;  . WISDOM TOOTH EXTRACTION  AS TEEN     OB History    Gravida  3   Para  3   Term  3   Preterm      AB      Living  2     SAB      TAB      Ectopic      Multiple      Live Births  3        Obstetric Comments  2004 FAILED INDUCTION;  PROLONGED ROM; C/S; PP BLOOD TRANSFUSION DUE TO BLOOD LOSS DURING C/S; PP ENDOMETRITIS; HOSPITALIZED FOR 4 WEEKS AT [facility]        Family History  Problem Relation Age of Onset  . Diabetes Mother   . Heart disease Mother   . Arthritis Father   . COPD Father   . Cancer Father        PROSTATE  . Hypertension Father     Social History   Tobacco Use  . Smoking status: Former Smoker    Quit date: 05/27/2010  Years since quitting: 9.0  . Smokeless tobacco: Never Used  Substance Use Topics  . Alcohol use: Yes    Comment: WEEKENDS; LAST DRANK 03/27/2012  . Drug use: Yes    Frequency: 2.0 times per week    Comment: MJ  03/27/2012    Home Medications Prior to Admission medications   Medication Sig Start Date End Date Taking? Authorizing Provider  acetaminophen (TYLENOL) 325 MG tablet Take 650 mg by mouth every 6 (six) hours as needed for pain.    [provider]  cephALEXin (KEFLEX) 500 MG capsule Take 1 capsule (500 mg total) by mouth 4 (four) times daily for 5 days. 06/22/19 06/27/19  Gailen Shelter, PA  HYDROcodone-acetaminophen (NORCO/VICODIN) 5-325 MG tablet Take 1 tablet by mouth every 6 (six) hours as needed. 05/12/19   Dione Booze, MD  ibuprofen (ADVIL,MOTRIN) 600 MG tablet Take 1 tablet (600 mg total) by mouth every 6 (six) hours as needed for  pain. 01/02/13   Jaymes Graff, MD  IRON PO Take 1 tablet by mouth daily.    [provider]  predniSONE (DELTASONE) 20 MG tablet Take 3 tablets (60 mg total) by mouth daily. 05/12/19   Dione Booze, MD  Prenatal Vit-Fe Fumarate-FA (PRENATAL VITAMINS PLUS) 27-1 MG TABS Take 1 tablet by mouth daily. 1 tab po once daily 04/01/12   Hayden Rasmussen, NP    Allergies    Patient has no known allergies.  Review of Systems   Review of Systems  Constitutional: Negative for chills and fever.  HENT: Negative for congestion.   Respiratory: Negative for cough and shortness of breath.   Cardiovascular: Negative for chest pain and leg swelling.  Gastrointestinal: Negative for abdominal pain and vomiting.  Genitourinary: Negative for dysuria.  Musculoskeletal: Negative for myalgias.  Skin: Negative for rash.       Right foot pain  Neurological: Negative for dizziness and headaches.    Physical Exam Updated Vital Signs BP 107/83 (BP Location: Right Arm)   Pulse 76   Temp 98.5 F (36.9 C) (Oral)   Resp 18   Ht 5' (1.524 m)   Wt 95.3 kg   LMP 06/08/2019   SpO2 98%   BMI 41.01 kg/m   Physical Exam Vitals and nursing note reviewed.  Constitutional:      General: She is not in acute distress.    Appearance: Normal appearance. She is not ill-appearing.  HENT:     Head: Normocephalic and atraumatic.     Mouth/Throat:     Mouth: Mucous membranes are moist.  Eyes:     General: No scleral icterus.       Right eye: No discharge.        Left eye: No discharge.     Conjunctiva/sclera: Conjunctivae normal.  Pulmonary:     Effort: Pulmonary effort is normal.     Breath sounds: No stridor.  Musculoskeletal:     Comments: For range of motion of all toes without tenderness.  No warmth of joints.  Skin:    Capillary Refill: Capillary refill takes less than 2 seconds.     Comments: Symptoms present to palpation of the skin between the third and fourth right toes.  There is a 1 cm gaping opening  of the skin in this area.  There is no purulent drainage.  Not currently bleeding.  No redness or erythema.  Mild swelling.  Neurological:     Mental Status: She is alert and oriented to person, place, and time.  Mental status is at baseline.     Comments: Sensation intact to all toes             ED Results / Procedures / Treatments   Labs (all labs ordered are listed, but only abnormal results are displayed) Labs Reviewed - No data to display  EKG None  Radiology No results found.  Procedures .Marland KitchenIncision and Drainage  Date/Time: 06/23/2019 6:55 AM Performed by: Gailen Shelter, PA Authorized by: Gailen Shelter, PA   Consent:    Consent obtained:  Verbal   Consent given by:  Patient   Risks discussed:  Bleeding, incomplete drainage, pain and damage to other organs   Alternatives discussed:  No treatment Universal protocol:    Procedure explained and questions answered to patient or proxy's satisfaction: yes     Relevant documents present and verified: yes     Test results available and properly labeled: yes     Imaging studies available: yes     Required blood products, implants, devices, and special equipment available: yes     Site/side marked: yes     Immediately prior to procedure a time out was called: yes     Patient identity confirmed:  Verbally with patient Location:    Type:  Seroma   Size:  Small   Location:  Lower extremity   Lower extremity location:  Toe   Toe location:  R third toe Pre-procedure details:    Skin preparation:  Betadine Anesthesia (see MAR for exact dosages):    Anesthesia method:  Local infiltration   Local anesthetic:  Lidocaine 1% WITH epi Procedure type:    Complexity:  Simple Procedure details:    Needle aspiration: no     Incision types:  Single straight   Incision depth:  Subcutaneous   Scalpel blade:  11   Wound management:  Probed and deloculated, irrigated with saline and extensive cleaning   Drainage:  Bloody  and serous   Drainage amount:  Scant Post-procedure details:    Patient tolerance of procedure:  Tolerated well, no immediate complications Comments:     11 blade scalpel used to extend the wound into the area of concern.  There is no purulent drainage.  Some small amount of bloody drainage obtained.  There is some serosanguineous fluid.   (including critical care time)  Medications Ordered in ED Medications  bupivacaine (MARCAINE) 0.5 % injection 10 mL (10 mLs Infiltration Given 06/22/19 1555)  cephALEXin (KEFLEX) capsule 500 mg (500 mg Oral Given 06/22/19 1555)    ED Course  I have reviewed the triage vital signs and the nursing notes.  Pertinent labs & imaging results that were available during my care of the patient were reviewed by me and considered in my medical decision making (see chart for details).    MDM Rules/Calculators/A&P                      Patient with foot wound.  No history of diabetes, no immunosuppressive history.  Not on steroids.  She was recently on steroids for possible gout however wound opened up over toe.  Concern for bacterial infection.  Low suspicion for septic arthritis as patient is able to fully range toe and has good sensation and only minimal tenderness to palpation of the wound.  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  I discussed this case with my attending physician who  cosigned this note including patient's presenting symptoms, physical exam, and planned diagnostics and interventions. Attending physician stated agreement with plan or made changes to plan which were implemented.   Attending physician assessed patient at bedside.  Small incision made over the area of concern for fluctuance.  There is no purulent drainage.  Some mild serosanguineous fluid came out.  Will discharge patient with antibiotics and recommended that she follow-up with her PCP for  reevaluation.  Final Clinical Impression(s) / ED Diagnoses Final diagnoses:  Cellulitis of foot    Rx / DC Orders ED Discharge Orders         Ordered    cephALEXin (KEFLEX) 500 MG capsule  4 times daily,   Status:  Discontinued     06/22/19 1559    cephALEXin (KEFLEX) 500 MG capsule  4 times daily     06/22/19 1628           Pati Gallo [place], [place] 06/23/19 2836    Maudie Flakes, MD 06/24/19 1635

## 2019-06-22 NOTE — Discharge Instructions (Addendum)
You have a area of infection on your right foot.  Please take antibiotics as prescribed.  Please use Tylenol or Profen for pain see instructions below.  Please follow-up with and establish care with a primary care doctor.

## 2019-06-22 NOTE — ED Triage Notes (Signed)
Per patient, states open sore on right foot between 2nd and 3rd toe-states she was diagnosed with gout months ago-states foot stays swollen

## 2021-06-11 IMAGING — CR DG FOOT COMPLETE 3+V*R*
3 series · 3 of 3 positions shown · non-contrast
Comparison: None.

CLINICAL DATA: Right dorsal foot pain and swelling

EXAM:
RIGHT FOOT COMPLETE - 3+ VIEW

[x foot ap right]
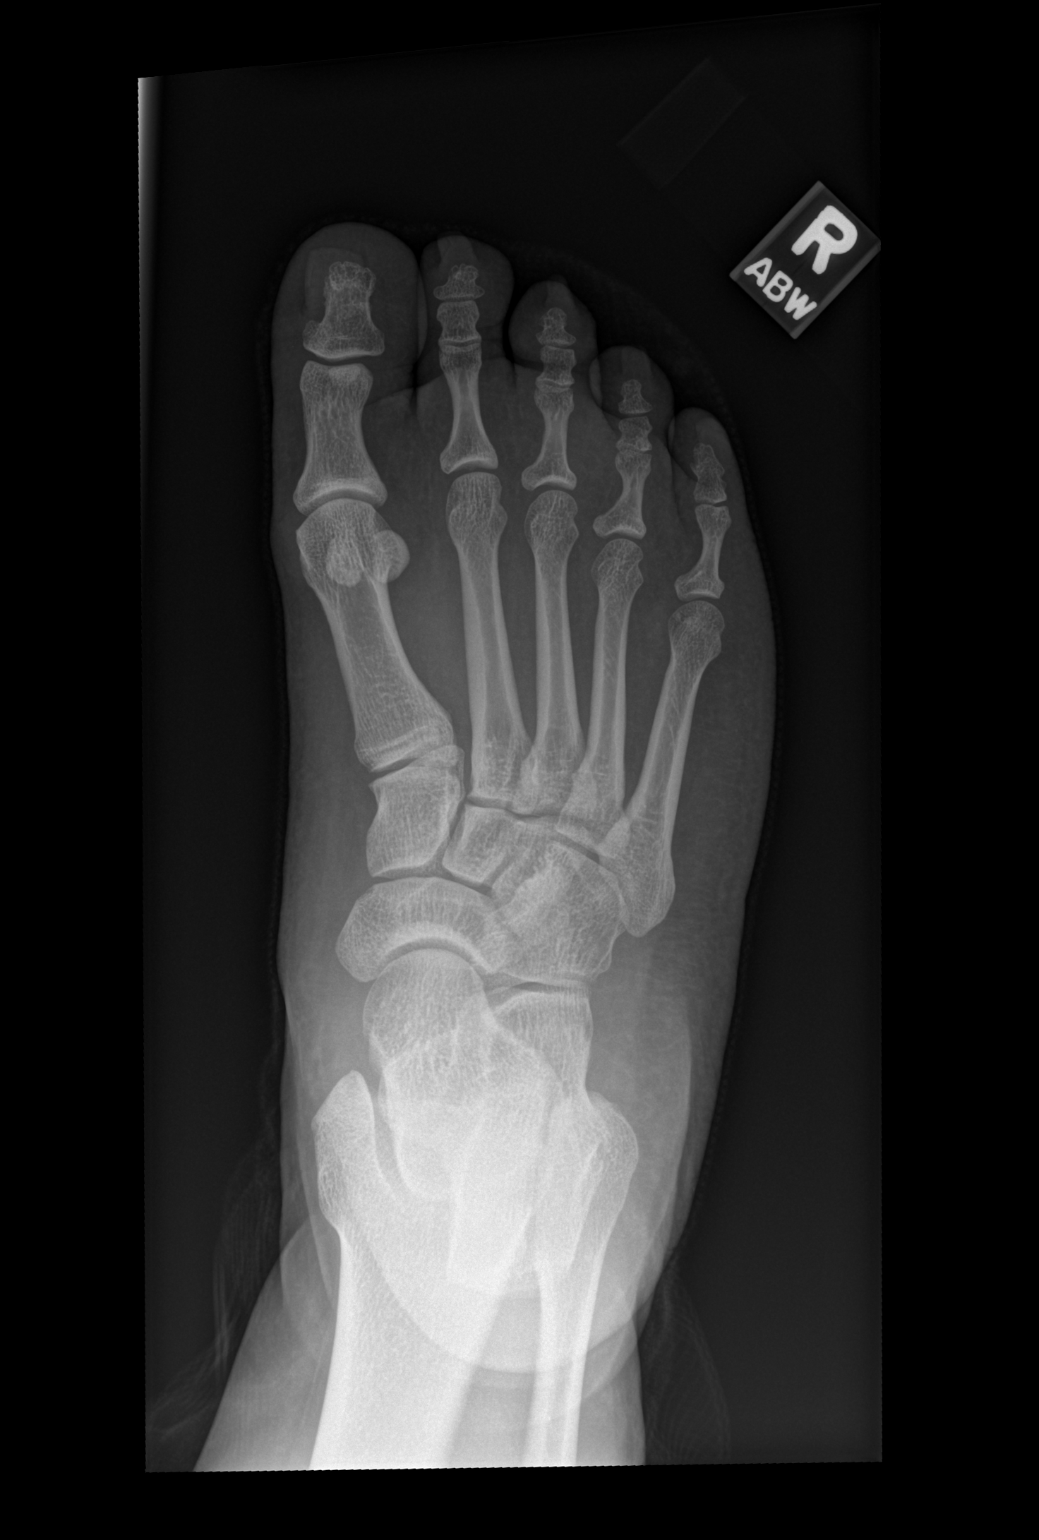

[x foot obl right]
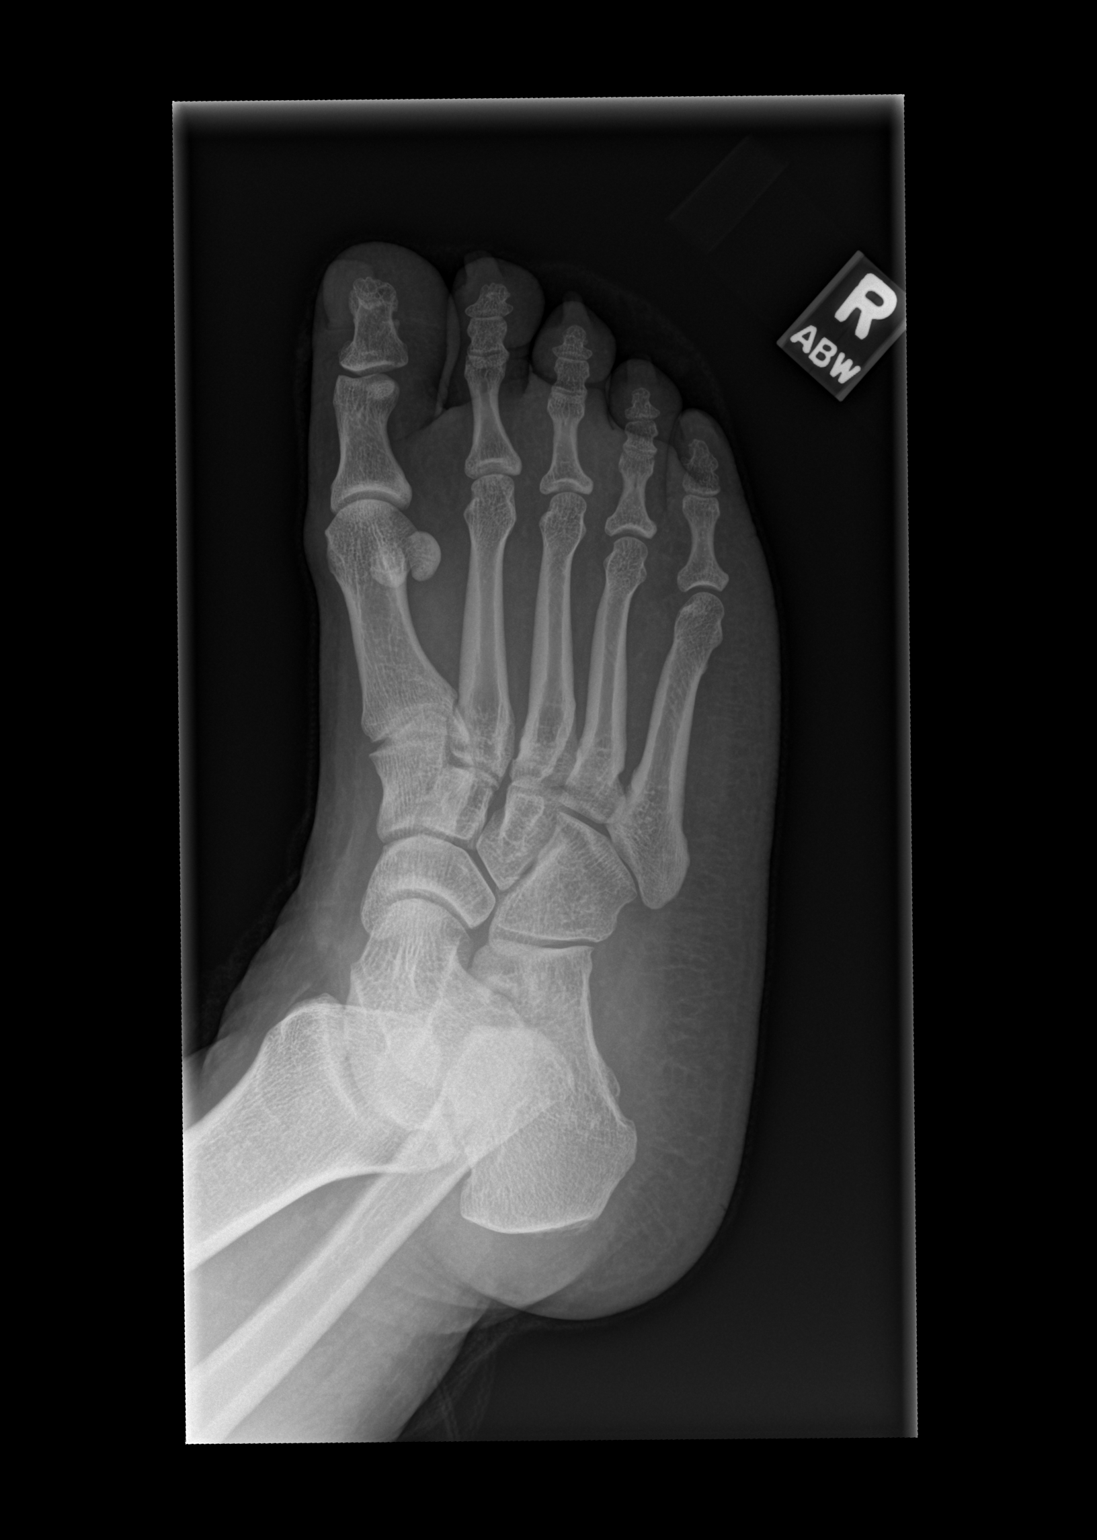

[x foot lat right]
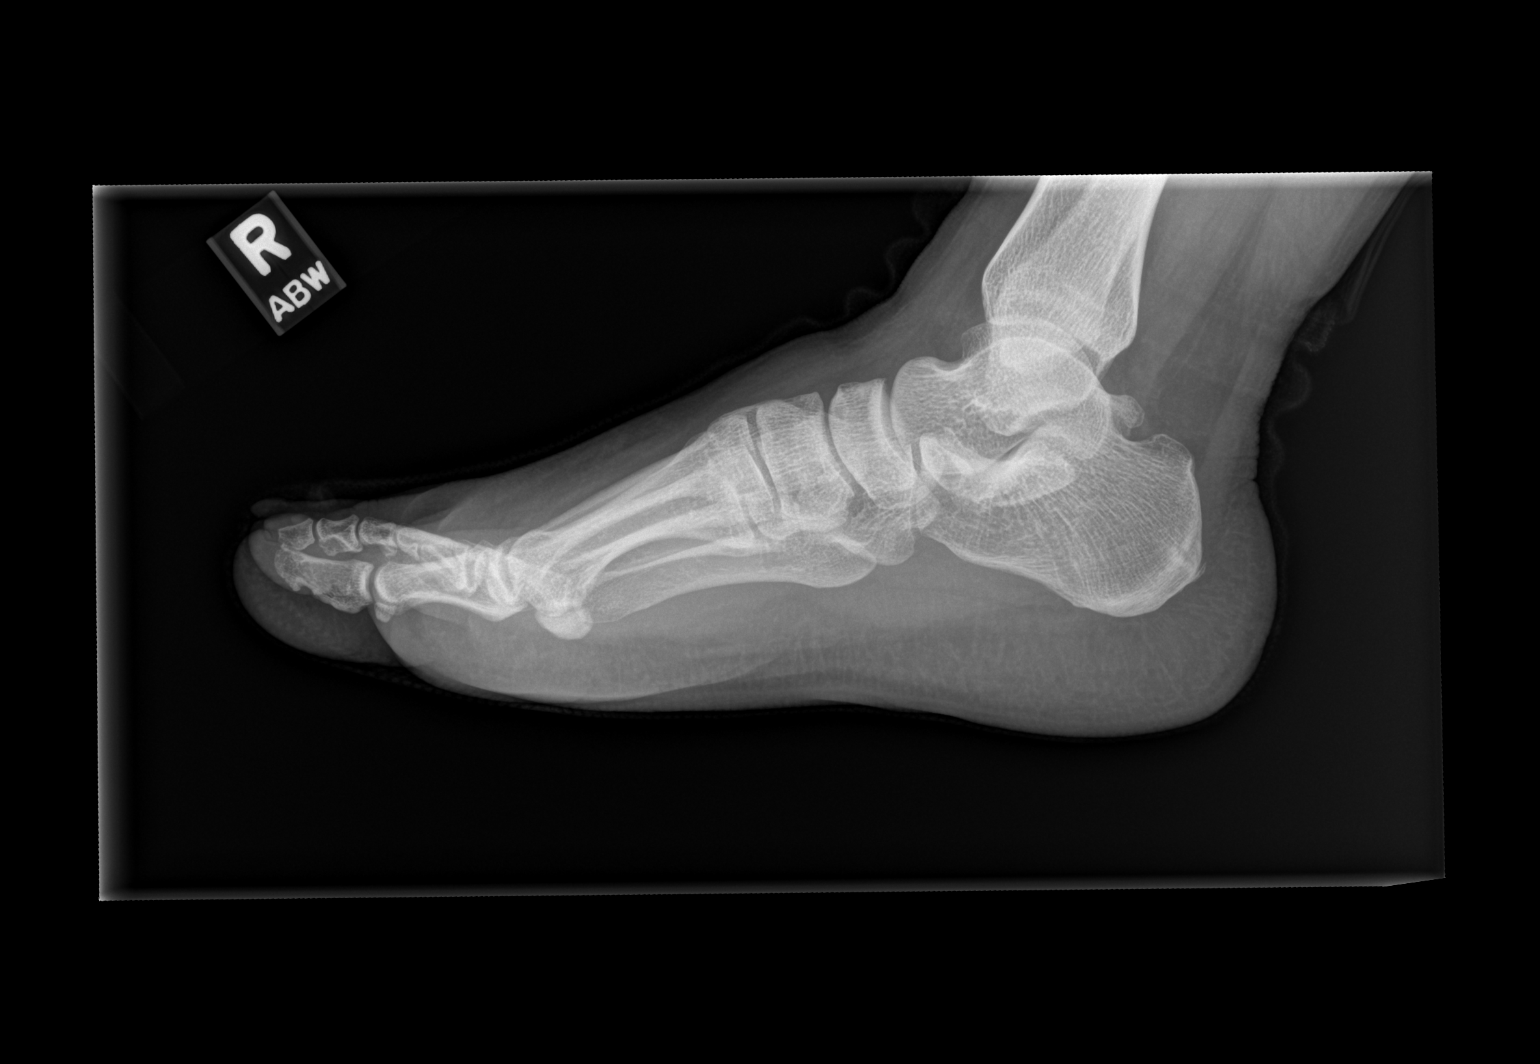

[3 of 3 positions shown; findings below may reference images not displayed]

FINDINGS: Frontal, oblique, and lateral views of the right foot demonstrate no
fractures. Alignment is anatomic. Joint spaces are well preserved.
Soft tissues are normal.
IMPRESSION: 1. Unremarkable right foot.

## 2023-07-15 ENCOUNTER — Ambulatory Visit: Payer: Self-pay
